# Patient Record
Sex: Female | Born: 1970
Health system: Southern US, Community
[De-identification: ages and names within clinical notes are randomized; demographics above are authoritative.]

## PROBLEM LIST (undated history)

## (undated) DIAGNOSIS — F419 Anxiety disorder, unspecified: Secondary | ICD-10-CM

## (undated) DIAGNOSIS — R519 Headache, unspecified: Secondary | ICD-10-CM

## (undated) DIAGNOSIS — I10 Essential (primary) hypertension: Secondary | ICD-10-CM

## (undated) DIAGNOSIS — R51 Headache: Secondary | ICD-10-CM

## (undated) DIAGNOSIS — Z972 Presence of dental prosthetic device (complete) (partial): Secondary | ICD-10-CM

## (undated) DIAGNOSIS — E785 Hyperlipidemia, unspecified: Secondary | ICD-10-CM

## (undated) DIAGNOSIS — F32A Depression, unspecified: Secondary | ICD-10-CM

## (undated) HISTORY — DX: Hyperlipidemia, unspecified: E78.5

## (undated) HISTORY — PX: ABDOMINAL HYSTERECTOMY: SHX81

## (undated) HISTORY — DX: Headache, unspecified: R51.9

## (undated) HISTORY — PX: MIDDLE EAR SURGERY: SHX713

## (undated) HISTORY — DX: Headache: R51

## (undated) HISTORY — DX: Anxiety disorder, unspecified: F41.9

## (undated) HISTORY — DX: Depression, unspecified: F32.A

---

## 2003-11-01 ENCOUNTER — Ambulatory Visit: Payer: Self-pay | Admitting: Unknown Physician Specialty

## 2005-11-25 ENCOUNTER — Emergency Department: Payer: Self-pay | Admitting: Emergency Medicine

## 2005-11-30 ENCOUNTER — Ambulatory Visit: Payer: Self-pay | Admitting: Surgery

## 2008-01-09 HISTORY — PX: CHOLECYSTECTOMY: SHX55

## 2008-09-23 ENCOUNTER — Ambulatory Visit: Payer: Self-pay | Admitting: Obstetrics and Gynecology

## 2008-09-29 ENCOUNTER — Ambulatory Visit: Payer: Self-pay | Admitting: Obstetrics and Gynecology

## 2009-01-07 ENCOUNTER — Observation Stay: Payer: Self-pay | Admitting: Obstetrics and Gynecology

## 2009-02-17 ENCOUNTER — Ambulatory Visit: Payer: Self-pay | Admitting: Obstetrics and Gynecology

## 2009-02-25 ENCOUNTER — Ambulatory Visit: Payer: Self-pay | Admitting: Obstetrics and Gynecology

## 2011-03-22 ENCOUNTER — Ambulatory Visit: Payer: Self-pay | Admitting: Obstetrics and Gynecology

## 2011-03-22 LAB — URINALYSIS, COMPLETE
Bilirubin,UR: NEGATIVE
Glucose,UR: NEGATIVE mg/dL (ref 0–75)
Ketone: NEGATIVE
Nitrite: NEGATIVE
Ph: 7 (ref 4.5–8.0)
Protein: NEGATIVE
RBC,UR: 1 /HPF (ref 0–5)
Specific Gravity: 1.006 (ref 1.003–1.030)
Squamous Epithelial: 14
WBC UR: 5 /HPF (ref 0–5)

## 2011-03-22 LAB — HEMOGLOBIN: HGB: 12.6 g/dL (ref 12.0–16.0)

## 2011-03-26 ENCOUNTER — Ambulatory Visit: Payer: Self-pay | Admitting: Obstetrics and Gynecology

## 2011-03-26 LAB — HEMOGLOBIN: HGB: 8.3 g/dL — ABNORMAL LOW (ref 12.0–16.0)

## 2011-03-26 LAB — PREGNANCY, URINE: Pregnancy Test, Urine: NEGATIVE m[IU]/mL

## 2011-03-26 LAB — HEMATOCRIT: HCT: 25.4 % — ABNORMAL LOW (ref 35.0–47.0)

## 2011-03-27 LAB — PATHOLOGY REPORT

## 2011-03-27 LAB — HEMATOCRIT: HCT: 31.5 % — ABNORMAL LOW (ref 35.0–47.0)

## 2011-08-06 ENCOUNTER — Ambulatory Visit: Payer: Self-pay | Admitting: Obstetrics and Gynecology

## 2012-08-29 ENCOUNTER — Ambulatory Visit: Payer: Self-pay | Admitting: Obstetrics and Gynecology

## 2014-05-02 NOTE — Op Note (Signed)
PATIENT NAME:  Alexis Berger, Alexis Berger MR#:  [id number] DATE OF BIRTH:  12/03/1970  DATE OF PROCEDURE:  03/26/2011  PREOPERATIVE DIAGNOSES:  1. Dysmenorrhea.  2. Evidence of hematometra on ultrasound.   POSTOPERATIVE DIAGNOSES:  1. Dysmenorrhea.  2. Evidence of hematometra on ultrasound.  3. Bilateral hydrosalpinx. 4. Severe adhesive disease.   PROCEDURES:  1. Dilation and curettage.  2. Hysteroscopy.  3. Laparoscopy. 4. Extensive adhesiolysis. 5. Bilateral salpingectomy. 6. Laparoscopic supracervical hysterectomy.   SURGEON: Ricky L. Logan Bores, MD  ASSISTANT: Suzy Bouchard, M.D.   ANESTHESIA: General orotracheal converted to general endotracheal by Dr. Pernell Dupre.   ESTIMATED BLOOD LOSS: 450 mL.   COMPLICATIONS: Suspected uterine perforation during dilation and curettage which led to laparoscopy with findings as noted above.   SPECIMENS: Uterus, bilateral tubes.   DRAINS: Foley placed at the end of case with good spillage of clear urine.   ANTIBIOTICS: 2 grams Ancef preoperatively and 2 grams Mefoxin postoperative.   PROCEDURE IN DETAIL: The patient is two years status post thermal ablation. She began having menstrual-type pains getting steadily worse over the past few months. Ultrasound showed what appeared to be trapped fluid at the upper fundus, unable to access intraoperatively and elected to proceed with dilation and curettage, hysteroscopy. Consent signed. Taken to the Operating Room, placed in supine position where anesthesia was initiated then placed in candy cane stirrups. Perineum and vagina prepped and draped in usual sterile fashion. Cervix was visualized, grasped with a single-tooth tenaculum. Dilated up to permit hysteroscope. Hysteroscopic entry showed what appeared to be a fairly thick fibrous wall at approximately 6 cm. Uterine sound was used to develop a tract which was dilated up to admit approximately 14 [place]. Attempted to place the camera and appeared to be a window  through which peritoneum could be visualized and therefore diagnosed with likely uterine perforation. Did not appear that we traveled along the obstructed canal.  At this time I elected to proceed with laparoscopy to evaluate possible perforation.    She was taken out of candy cane stirrups. Abdomen was prepped again. Foley catheter used to drain 100cc clear urine. Patient was placed in bumblebee stirrups and laparoscope was inserted infraumbilically and noted markedly dilated, hardened fallopian tubes bilaterally with the right fallopian to be involved with the ascending colon and adhered to the abdominal wall anteriorly. Left fallopian tube adherent more distally to small bowel. Second port was placed in left lower quadrant and was able to bluntly dissect free the right fallopian tube which had a clubbed appearance. At this point I called for Dr. Feliberto Gottron to come help and made the decision to proceed with bilateral salpingectomy and probable hysterectomy.   At this time we paused, asked circulator to bring the husband to the phone and discussed situation with him and stated they would prefer hysterectomy anyway and told me it was okay to proceed.    Performed bilateral salpingectomy using Harmonic scalpel; because of suspected infection, these were moved out of the operative field to be removed separately   With countertraction using the Harmonic scalpel to free the left ovary from the uterus and developed the broad ligament on the left. Bladder flap was developed cauterizing the right uterine artery with Kleppingers.  Proceeded in similar fashion on the right.  Uterus was separated rom the cervical stump using harmonic scalpel. Hulka tenaculum was removed. With sponge stick in the vagina (unable to reach with finger), persistent oozing from the right uterine artery was controlled with Kleppinger taking  care to elevate away from the ureter.   At this point the left lower quadrant port site was  replaced with a morcellator and uterus was removed in morcellator fashion.   We then used EndoCatch to remove the fallopian tubes separately with each tube containing a marked amount of chocolate colored fluid consistent with hematosalpinx. Pneumoperitoneum was allowed to return and hemosatsis was seen to be excellent. We were unable to identify the perforation prior to the hysterectomy. Ports removed. Pneumoperitoneum was allowed to resolve. Incisions closed with deep of 0, sub-Q 3-0 Vicryl, and steri strips. Foley catheter was inserted at the end of the case. Patient tolerated procedure well.   Procedure was extended by approximately two hours due to the extensive adhesiolysis required to free up the tubes from the abdominal wall and the bowel. I anticipate a routine postoperative course.   ____________________________ Reatha Harpsicky L. Logan BoresEvans, MD rle:cms D: 03/26/2011 12:00:48 ET T: 03/27/2011 08:33:55 ET JOB#: [id number]  cc: Clide Clifficky L. Logan BoresEvans, MD, <Dictator> Augustina MoodICK L Lateef Juncaj MD ELECTRONICALLY SIGNED 03/27/2011 16:22

## 2014-05-10 ENCOUNTER — Other Ambulatory Visit: Payer: Self-pay | Admitting: Unknown Physician Specialty

## 2014-05-10 DIAGNOSIS — H6120 Impacted cerumen, unspecified ear: Secondary | ICD-10-CM

## 2014-05-10 DIAGNOSIS — H7201 Central perforation of tympanic membrane, right ear: Secondary | ICD-10-CM

## 2014-05-10 DIAGNOSIS — H9211 Otorrhea, right ear: Secondary | ICD-10-CM

## 2014-05-19 ENCOUNTER — Ambulatory Visit: Payer: Self-pay

## 2014-05-19 ENCOUNTER — Ambulatory Visit
Admission: RE | Admit: 2014-05-19 | Discharge: 2014-05-19 | Disposition: A | Discharge status: Home / Self Care | Payer: 59 | Source: Ambulatory Visit | Attending: Unknown Physician Specialty | Admitting: Unknown Physician Specialty

## 2014-05-19 DIAGNOSIS — H6120 Impacted cerumen, unspecified ear: Secondary | ICD-10-CM

## 2014-05-19 DIAGNOSIS — H9211 Otorrhea, right ear: Secondary | ICD-10-CM

## 2014-05-19 DIAGNOSIS — H7011 Chronic mastoiditis, right ear: Secondary | ICD-10-CM | POA: Insufficient documentation

## 2014-05-19 DIAGNOSIS — H7201 Central perforation of tympanic membrane, right ear: Secondary | ICD-10-CM

## 2014-05-19 DIAGNOSIS — H7291 Unspecified perforation of tympanic membrane, right ear: Secondary | ICD-10-CM | POA: Diagnosis not present

## 2014-07-01 ENCOUNTER — Ambulatory Visit (INDEPENDENT_AMBULATORY_CARE_PROVIDER_SITE_OTHER): Payer: 59 | Admitting: Internal Medicine

## 2014-07-01 ENCOUNTER — Encounter (INDEPENDENT_AMBULATORY_CARE_PROVIDER_SITE_OTHER): Payer: Self-pay

## 2014-07-01 ENCOUNTER — Encounter: Payer: Self-pay | Admitting: Internal Medicine

## 2014-07-01 VITALS — BP 132/84 | HR 83 | Temp 98.1°F | Ht 64.0 in | Wt 195.0 lb

## 2014-07-01 DIAGNOSIS — G44209 Tension-type headache, unspecified, not intractable: Secondary | ICD-10-CM | POA: Diagnosis not present

## 2014-07-01 DIAGNOSIS — F329 Major depressive disorder, single episode, unspecified: Secondary | ICD-10-CM | POA: Insufficient documentation

## 2014-07-01 DIAGNOSIS — F411 Generalized anxiety disorder: Secondary | ICD-10-CM

## 2014-07-01 DIAGNOSIS — F419 Anxiety disorder, unspecified: Secondary | ICD-10-CM

## 2014-07-01 DIAGNOSIS — F32A Depression, unspecified: Secondary | ICD-10-CM | POA: Insufficient documentation

## 2014-07-01 DIAGNOSIS — I10 Essential (primary) hypertension: Secondary | ICD-10-CM | POA: Insufficient documentation

## 2014-07-01 DIAGNOSIS — R030 Elevated blood-pressure reading, without diagnosis of hypertension: Secondary | ICD-10-CM

## 2014-07-01 MED ORDER — BUPROPION HCL ER (XL) 150 MG PO TB24: 150.0000 mg | ORAL_TABLET | Freq: Every day | ORAL | Status: DC

## 2014-07-01 NOTE — Assessment & Plan Note (Signed)
Support offered today Will start Wellbutrin 150 mg daily She will let me know how she is doing in 4 weeks Unable to start SSRI as she is taking Phentermine and risk for Seratonin Syndrome

## 2014-07-01 NOTE — Progress Notes (Signed)
Pre visit review using our clinic review tool, if applicable. No additional management support is needed unless otherwise documented below in the visit note. 

## 2014-07-01 NOTE — Patient Instructions (Signed)
Stress and Stress Management Stress is a normal reaction to life events. It is what you feel when life demands more than you are used to or more than you can handle. Some stress can be useful. For example, the stress reaction can help you catch the last bus of the day, study for a test, or meet a deadline at work. But stress that occurs too often or for too long can cause problems. It can affect your emotional health and interfere with relationships and normal daily activities. Too much stress can weaken your immune system and increase your risk for physical illness. If you already have a medical problem, stress can make it worse. CAUSES  All sorts of life events may cause stress. An event that causes stress for one person may not be stressful for another person. Major life events commonly cause stress. These may be positive or negative. Examples include losing your job, moving into a new home, getting married, having a baby, or losing a loved one. Less obvious life events may also cause stress, especially if they occur day after day or in combination. Examples include working long hours, driving in traffic, caring for children, being in debt, or being in a difficult relationship. SIGNS AND SYMPTOMS Stress may cause emotional symptoms including, the following:  Anxiety. This is feeling worried, afraid, on edge, overwhelmed, or out of control.  Anger. This is feeling irritated or impatient.  Depression. This is feeling sad, down, helpless, or guilty.  Difficulty focusing, remembering, or making decisions. Stress may cause physical symptoms, including the following:   Aches and pains. These may affect your head, neck, back, stomach, or other areas of your body.  Tight muscles or clenched jaw.  Low energy or trouble sleeping. Stress may cause unhealthy behaviors, including the following:   Eating to feel better (overeating) or skipping meals.  Sleeping too little, too much, or both.  Working  too much or putting off tasks (procrastination).  Smoking, drinking alcohol, or using drugs to feel better. DIAGNOSIS  Stress is diagnosed through an assessment by your health care provider. Your health care provider will ask questions about your symptoms and any stressful life events.Your health care provider will also ask about your medical history and may order blood tests or other tests. Certain medical conditions and medicine can cause physical symptoms similar to stress. Mental illness can cause emotional symptoms and unhealthy behaviors similar to stress. Your health care provider may refer you to a mental health professional for further evaluation.  TREATMENT  Stress management is the recommended treatment for stress.The goals of stress management are reducing stressful life events and coping with stress in healthy ways.  Techniques for reducing stressful life events include the following:  Stress identification. Self-monitor for stress and identify what causes stress for you. These skills may help you to avoid some stressful events.  Time management. Set your priorities, keep a calendar of events, and learn to say "no." These tools can help you avoid making too many commitments. Techniques for coping with stress include the following:  Rethinking the problem. Try to think realistically about stressful events rather than ignoring them or overreacting. Try to find the positives in a stressful situation rather than focusing on the negatives.  Exercise. Physical exercise can release both physical and emotional tension. The key is to find a form of exercise you enjoy and do it regularly.  Relaxation techniques. These relax the body and mind. Examples include yoga, meditation, tai chi, biofeedback, deep  breathing, progressive muscle relaxation, listening to music, being out in nature, journaling, and other hobbies. Again, the key is to find one or more that you enjoy and can do  regularly.  Healthy lifestyle. Eat a balanced diet, get plenty of sleep, and do not smoke. Avoid using alcohol or drugs to relax.  Strong support network. Spend time with family, friends, or other people you enjoy being around.Express your feelings and talk things over with someone you trust. Counseling or talktherapy with a mental health professional may be helpful if you are having difficulty managing stress on your own. Medicine is typically not recommended for the treatment of stress.Talk to your health care provider if you think you need medicine for symptoms of stress. HOME CARE INSTRUCTIONS  Keep all follow-up visits as directed by your health care provider.  Take all medicines as directed by your health care provider. SEEK MEDICAL CARE IF:  Your symptoms get worse or you start having new symptoms.  You feel overwhelmed by your problems and can no longer manage them on your own. SEEK IMMEDIATE MEDICAL CARE IF:  You feel like hurting yourself or someone else. Document Released: 06/20/2000 Document Revised: 05/11/2013 Document Reviewed: 08/19/2012 ExitCare Patient Information 2015 [employer]. This information is not intended to replace advice given to you by your health care provider. Make sure you discuss any questions you have with your health care provider.  

## 2014-07-01 NOTE — Progress Notes (Signed)
HPI  Pt presents to the clinic today to establish care and for management of the conditions listed below. She did not have a PCP but followed yearly with her GYN, Dr. Logan Bores at [place].  Flu: 2014 Tetanus: > 10 years ago LMP: Hysterectomy Pap Smear: 2015- normal Mammogram: 2014 at [facility] Screening: as needed Dentist: bianually  Frequent Headaches: She reports it starts in the back of her head radiates down her neck and to the top of her head. She reports it feels like a pounding sensation. She denies blurred vision, dizziness, sensitivity to light or sound, nausea or vomiting. She has a headache almost every day. She is under a lot of stress at work at not sure if it is contributing. She takes Tylenol without much relief.  She is also concerned about elevated blood pressure. This has been going on for the last month. She reports it has been as high as 167/107. She is taking it with an automatic cuff. She denies blurred vision, dizziness, chest pain or shortness of breath. BP today is 132/84. She reports she is going to the weight loss clinic, and is prescribed Phentermine. She reports she has lost 42 pounds over the last year.  Anxiety: This started about 7 months ago. It is triggered by her daughters recent engagement. She reports her and her daughter have a very strong bond. She feels like she is losing her daughter. She cries all the time and anytime she has to do anything with the wedding planning, she gets very anxious and upset. She does not have any issues with her daughters fiancee. He is very nice. She reports she has never been treated for anxiety or depression in the past.  Past Medical History  Diagnosis Date  . Frequent headaches     Current Outpatient Prescriptions  Medication Sig Dispense Refill  . ibuprofen (ADVIL,MOTRIN) 200 MG tablet Take 200 mg by mouth as needed.    . phentermine 37.5 MG capsule Take 37.5 mg by mouth every morning.    . Probiotic Product  (PROBIOTIC DAILY) CAPS Take 1 capsule by mouth daily.    Bernadette Hoit Sodium (SENNA PLUS PO) Take 2 tablets by mouth 2 (two) times daily.     No current facility-administered medications for this visit.    No Known Allergies  Family History  Problem Relation Age of Onset  . Hypertension Mother     History   Social History  . Marital Status: Married    Spouse Name: N/A  . Number of Children: N/A  . Years of Education: N/A   Occupational History  . Not on file.   Social History Main Topics  . Smoking status: Never Smoker   . Smokeless tobacco: Never Used  . Alcohol Use: No  . Drug Use: Not on file  . Sexual Activity: Not on file   Other Topics Concern  . Not on file   Social History Narrative  . No narrative on file    ROS:  Constitutional: Pt reports headache. Denies fever, malaise, fatigue, or abrupt weight changes.  HEENT: Denies eye pain, eye redness, ear pain, ringing in the ears, wax buildup, runny nose, nasal congestion, bloody nose, or sore throat. Respiratory: Denies difficulty breathing, shortness of breath, cough or sputum production.   Cardiovascular: Denies chest pain, chest tightness, palpitations or swelling in the hands or feet.  Gastrointestinal: Denies abdominal pain, bloating, constipation, diarrhea or blood in the stool.  GU: Denies frequency, urgency, pain with urination, blood in  urine, odor or discharge. Musculoskeletal: Denies decrease in range of motion, difficulty with gait, muscle pain or joint pain and swelling.  Skin: Denies redness, rashes, lesions or ulcercations.  Neurological: Denies dizziness, difficulty with memory, difficulty with speech or problems with balance and coordination.  Psych: Pt reports anxiety, depression. Denies SI/HI.  No other specific complaints in a complete review of systems (except as listed in HPI above).  PE:  Ht  (1.626 m)  Wt 195 lb (88.451 kg)  BMI 33.46 kg/m2 Wt Readings from Last 3  Encounters:  07/01/14 195 lb (88.451 kg)    General: Appears her stated age, obese in NAD. Skin: Warm, dry and intact. HEENT: Head: normal shape and size; Eyes: sclera white, no icterus, conjunctiva pink, PERRLA and EOMs intact; Ears: Tm's gray and intact, normal light reflex;  Cardiovascular: Normal rate and rhythm. S1,S2 noted.  No murmur, rubs or gallops noted.  Pulmonary/Chest: Normal effort and positive vesicular breath sounds. No respiratory distress. No wheezes, rales or ronchi noted.  Musculoskeletal: Normal flexion, extension and rotation of the cervical spine. Para cervical muscles very tense.  Neurological: Alert and oriented.  Coordination normal.  Psychiatric: She is anxious and tearful today. Judgement and thought content seem normal.    Assessment and Plan:  Make an appt for your annual exam

## 2014-07-01 NOTE — Assessment & Plan Note (Signed)
Stress/anxiety related May improve with treatment of the stress and anxiety Ibuprofen 600 mg prn  Advised her to try getting a massage once monthly until her daughters wedding

## 2014-07-01 NOTE — Assessment & Plan Note (Signed)
Likely r/t Phentermine but not high enough for concern Will continue to monitor at this time

## 2014-07-23 ENCOUNTER — Other Ambulatory Visit: Payer: Self-pay | Admitting: Internal Medicine

## 2014-07-23 DIAGNOSIS — Z0000 Encounter for general adult medical examination without abnormal findings: Secondary | ICD-10-CM

## 2014-08-02 ENCOUNTER — Other Ambulatory Visit (INDEPENDENT_AMBULATORY_CARE_PROVIDER_SITE_OTHER): Payer: 59

## 2014-08-02 DIAGNOSIS — Z0000 Encounter for general adult medical examination without abnormal findings: Secondary | ICD-10-CM | POA: Diagnosis not present

## 2014-08-02 LAB — COMPREHENSIVE METABOLIC PANEL
ALT: 21 U/L (ref 0–35)
AST: 18 U/L (ref 0–37)
Albumin: 4.1 g/dL (ref 3.5–5.2)
Alkaline Phosphatase: 96 U/L (ref 39–117)
BUN: 9 mg/dL (ref 6–23)
CO2: 29 mEq/L (ref 19–32)
Calcium: 9.1 mg/dL (ref 8.4–10.5)
Chloride: 103 mEq/L (ref 96–112)
Creatinine, Ser: 0.84 mg/dL (ref 0.40–1.20)
GFR: 77.49 mL/min (ref >60.00)
Glucose, Bld: 115 mg/dL — ABNORMAL HIGH (ref 70–99)
Potassium: 3.7 mEq/L (ref 3.5–5.1)
Sodium: 140 mEq/L (ref 135–145)
Total Bilirubin: 0.4 mg/dL (ref 0.2–1.2)
Total Protein: 6.9 g/dL (ref 6.0–8.3)

## 2014-08-02 LAB — LIPID PANEL
Cholesterol: 174 mg/dL (ref 0–200)
HDL: 47.7 mg/dL (ref >39.00)
LDL Cholesterol: 91 mg/dL (ref 0–99)
NonHDL: 126.3
Total CHOL/HDL Ratio: 4
Triglycerides: 179 mg/dL — ABNORMAL HIGH (ref 0.0–149.0)
VLDL: 35.8 mg/dL (ref 0.0–40.0)

## 2014-08-02 LAB — CBC
HCT: 43.6 % (ref 36.0–46.0)
Hemoglobin: 14.5 g/dL (ref 12.0–15.0)
MCHC: 33.2 g/dL (ref 30.0–36.0)
MCV: 88.2 fl (ref 78.0–100.0)
Platelets: 247 10*3/uL (ref 150.0–400.0)
RBC: 4.94 Mil/uL (ref 3.87–5.11)
RDW: 13 % (ref 11.5–15.5)
WBC: 6.4 10*3/uL (ref 4.0–10.5)

## 2014-08-02 LAB — TSH: TSH: 3.6 u[IU]/mL (ref 0.35–4.50)

## 2014-08-06 ENCOUNTER — Ambulatory Visit (INDEPENDENT_AMBULATORY_CARE_PROVIDER_SITE_OTHER): Payer: 59 | Admitting: Internal Medicine

## 2014-08-06 ENCOUNTER — Encounter: Payer: Self-pay | Admitting: Internal Medicine

## 2014-08-06 VITALS — BP 128/88 | HR 77 | Temp 98.3°F | Ht 64.0 in | Wt 197.8 lb

## 2014-08-06 DIAGNOSIS — Z0000 Encounter for general adult medical examination without abnormal findings: Secondary | ICD-10-CM

## 2014-08-06 DIAGNOSIS — Z1239 Encounter for other screening for malignant neoplasm of breast: Secondary | ICD-10-CM

## 2014-08-06 NOTE — Progress Notes (Signed)
Pre visit review using our clinic review tool, if applicable. No additional management support is needed unless otherwise documented below in the visit note. 

## 2014-08-06 NOTE — Patient Instructions (Signed)

## 2014-08-06 NOTE — Progress Notes (Signed)
Subjective:    Patient ID: Alexis Berger, female    DOB: 1970-12-06, 44 y.o.   MRN: [id number]  HPI  Pt presents to the clinic today for her annual exam.  Flu: 2014 Tetanus: > 10 years ago LMP: Hysterectomy Pap Smear: 2015- normal Mammogram: 2014 at [facility] Vision Screening: as needed Dentist: bianually   Diet: She eats whatever she wants. Fried foods once per week. Fruits and veggies twice per week. She drinks mostly sweet tea, 2 cups of water per day. Exercise: She walks 4-5 times per week, for about 30 minutes. She can walk 2 miles during that time frame.  Review of Systems      Past Medical History  Diagnosis Date  . Frequent headaches     Current Outpatient Prescriptions  Medication Sig Dispense Refill  . buPROPion (WELLBUTRIN XL) 150 MG 24 hr tablet Take 1 tablet (150 mg total) by mouth daily. 30 tablet 2  . ibuprofen (ADVIL,MOTRIN) 200 MG tablet Take 200 mg by mouth as needed.    . phentermine 37.5 MG capsule Take 37.5 mg by mouth every morning.    . Probiotic Product (PROBIOTIC DAILY) CAPS Take 1 capsule by mouth daily.    Bernadette Hoit Sodium (SENNA PLUS PO) Take 2 tablets by mouth 2 (two) times daily.     No current facility-administered medications for this visit.    No Known Allergies  Family History  Problem Relation Age of Onset  . Hypertension Mother     History   Social History  . Marital Status: Married    Spouse Name: N/A  . Number of Children: N/A  . Years of Education: N/A   Occupational History  . Not on file.   Social History Main Topics  . Smoking status: Never Smoker   . Smokeless tobacco: Never Used  . Alcohol Use: No  . Drug Use: No  . Sexual Activity: Yes   Other Topics Concern  . Not on file   Social History Narrative     Constitutional: Denies fever, malaise, fatigue, headache or abrupt weight changes.  HEENT: Denies eye pain, eye redness, ear pain, ringing in the ears, wax buildup, runny nose, nasal  congestion, bloody nose, or sore throat. Respiratory: Denies difficulty breathing, shortness of breath, cough or sputum production.   Cardiovascular: Pt reports mild cough. Denies chest pain, chest tightness, palpitations or swelling in the hands or feet.  Gastrointestinal: Pt reports constipation. Denies abdominal pain, bloating, constipation, diarrhea or blood in the stool.  GU: Denies urgency, frequency, pain with urination, burning sensation, blood in urine, odor or discharge. Musculoskeletal: Denies decrease in range of motion, difficulty with gait, muscle pain or joint pain and swelling.  Skin: Denies redness, rashes, lesions or ulcercations.  Neurological: Denies dizziness, difficulty with memory, difficulty with speech or problems with balance and coordination.  Psych: Pt reports anxiety. Denies depression, SI/HI.  No other specific complaints in a complete review of systems (except as listed in HPI above).  Objective:   Physical Exam    BP 128/88 mmHg  Pulse 77  Temp(Src) 98.3 F (36.8 C) (Oral)  Ht  (1.626 m)  Wt 197 lb 12 oz (89.699 kg)  BMI 33.93 kg/m2  SpO2 97% Wt Readings from Last 3 Encounters:  08/06/14 197 lb 12 oz (89.699 kg)  07/01/14 195 lb (88.451 kg)    General: Appears her stated age, well developed, well nourished in NAD. Skin: Warm, dry and intact. No rashes, lesions or ulcerations noted.  HEENT: Head: normal shape and size; Eyes: sclera white, no icterus, conjunctiva pink, PERRLA and EOMs intact; Left Ear: cerumen impaction, Right Ear: Large hole in the TM; Throat/Mouth: Teeth present, mucosa pink and moist, no exudate, lesions or ulcerations noted.  Neck:  Neck supple, trachea midline. No masses, lumps or thyromegaly present.  Cardiovascular: Normal rate and rhythm. S1,S2 noted.  No murmur, rubs or gallops noted. No JVD or BLE edema. No carotid bruits noted. Pulmonary/Chest: Normal effort and positive vesicular breath sounds. No respiratory distress.  No wheezes, rales or ronchi noted.  Abdomen: Soft and nontender. Normal bowel sounds, no bruits noted. No distention or masses noted. Liver, spleen and kidneys non palpable. Musculoskeletal: Normal range of motion. Strength 5/5 BUE/BLE. No difficulty with gait.  Neurological: Alert and oriented. Cranial nerves II-XII grossly intact. Coordination normal.  Psychiatric: Mood and affect normal. She does feel like the Wellbutrin is taking the edge off.     BMET    Component Value Date/Time   NA 140 08/02/2014 0812   K 3.7 08/02/2014 0812   CL 103 08/02/2014 0812   CO2 29 08/02/2014 0812   GLUCOSE 115* 08/02/2014 0812   BUN 9 08/02/2014 0812   CREATININE 0.84 08/02/2014 0812   CALCIUM 9.1 08/02/2014 0812    Lipid Panel     Component Value Date/Time   CHOL 174 08/02/2014 0812   TRIG 179.0* 08/02/2014 0812   HDL 47.70 08/02/2014 0812   CHOLHDL 4 08/02/2014 0812   VLDL 35.8 08/02/2014 0812   LDLCALC 91 08/02/2014 0812    CBC    Component Value Date/Time   WBC 6.4 08/02/2014 0812   RBC 4.94 08/02/2014 0812   HGB 14.5 08/02/2014 0812   HGB 8.3* 03/26/2011 1155   HCT 43.6 08/02/2014 0812   HCT 31.5* 03/27/2011 0559   PLT 247.0 08/02/2014 0812   MCV 88.2 08/02/2014 0812   MCHC 33.2 08/02/2014 0812   RDW 13.0 08/02/2014 0812    Hgb A1C No results found for: HGBA1C     Assessment & Plan:   Preventative Health Maintenance:  Advised her to get her flu shot in the fall She declines Tdap today Pelvic exam due in 2020 Encouraged her to work on diet and exercise CBC, CMET, TSH and Lipid Profile 1 week ago reviewed, slightly elevated Triglycerides- Advised her to consume a low fat, low cholesterol diet. Fish oil once daily may be beneficial as well. Referral placed for Mammogram, she will call [facility] to schedule Continue to see an eye doctor and dentist on an annual basis  RTC in 1 year for an annual exam

## 2014-09-24 ENCOUNTER — Other Ambulatory Visit: Payer: Self-pay | Admitting: Internal Medicine

## 2015-07-01 ENCOUNTER — Encounter: Payer: 59 | Admitting: Internal Medicine

## 2015-08-25 ENCOUNTER — Encounter: Payer: Self-pay | Admitting: Internal Medicine

## 2015-08-25 ENCOUNTER — Other Ambulatory Visit: Payer: Self-pay | Admitting: Internal Medicine

## 2015-08-25 ENCOUNTER — Ambulatory Visit (INDEPENDENT_AMBULATORY_CARE_PROVIDER_SITE_OTHER): Payer: 59 | Admitting: Internal Medicine

## 2015-08-25 VITALS — BP 142/96 | HR 78 | Temp 98.8°F | Wt 211.0 lb

## 2015-08-25 DIAGNOSIS — F329 Major depressive disorder, single episode, unspecified: Secondary | ICD-10-CM

## 2015-08-25 DIAGNOSIS — F32A Depression, unspecified: Secondary | ICD-10-CM

## 2015-08-25 DIAGNOSIS — F418 Other specified anxiety disorders: Secondary | ICD-10-CM

## 2015-08-25 DIAGNOSIS — F419 Anxiety disorder, unspecified: Principal | ICD-10-CM

## 2015-08-25 LAB — LUTEINIZING HORMONE: LH: 1.24 m[IU]/mL

## 2015-08-25 LAB — TSH: TSH: 3.19 u[IU]/mL (ref 0.35–4.50)

## 2015-08-25 LAB — FOLLICLE STIMULATING HORMONE: FSH: 3.9 m[IU]/mL

## 2015-08-25 MED ORDER — SERTRALINE HCL 50 MG PO TABS: ORAL_TABLET | ORAL | 2 refills | Status: DC

## 2015-08-25 MED ORDER — ALPRAZOLAM 0.5 MG PO TABS: 0.5000 mg | ORAL_TABLET | Freq: Every day | ORAL | 0 refills | Status: DC | PRN

## 2015-08-25 NOTE — Progress Notes (Signed)
Subjective:    Patient ID: Alexis Berger, female    DOB: 07-31-1970, 45 y.o.   MRN: [id number]  HPI  Pt presents to the clinic today to follow up stress and anxiety. She reports she has a lot of stress as work. Her father was also recently diagnosed with Alzheimers and this is weighing heavily on her. Her husband recently had back surgery and she is having to help take care of him. She feels overwhelmed, is having mood swings and cries over little things. She is not sleeping well, having hot flashes at times. At times, she feels like her heart is racing and feelings like her heart is skipping a beat. She reports the Wellbutrin is not helping like it once was. She denies SI/HI. She would like to see if she is menopausal, she is unsure because she had a partial hysterectomy.  Review of Systems      Past Medical History:  Diagnosis Date  . Frequent headaches     Current Outpatient Prescriptions  Medication Sig Dispense Refill  . buPROPion (WELLBUTRIN XL) 150 MG 24 hr tablet TAKE ONE TABLET BY MOUTH EVERY DAY 30 tablet 5  . ibuprofen (ADVIL,MOTRIN) 200 MG tablet Take 200 mg by mouth as needed.    . Probiotic Product (PROBIOTIC DAILY) CAPS Take 1 capsule by mouth daily.    Bernadette Hoit. Sennosides-Docusate Sodium (SENNA PLUS PO) Take 2 tablets by mouth 2 (two) times daily.    . phentermine 37.5 MG capsule Take 37.5 mg by mouth every morning.     No current facility-administered medications for this visit.     No Known Allergies  Family History  Problem Relation Age of Onset  . Hypertension Mother     Social History   Social History  . Marital status: Married    Spouse name: N/A  . Number of children: N/A  . Years of education: N/A   Occupational History  . Not on file.   Social History Main Topics  . Smoking status: Never Smoker  . Smokeless tobacco: Never Used  . Alcohol use No  . Drug use: No  . Sexual activity: Yes   Other Topics Concern  . Not on file   Social History  Narrative  . No narrative on file     Constitutional: Denies fever, malaise, fatigue, headache or abrupt weight changes.  Respiratory: Denies difficulty breathing, shortness of breath, cough or sputum production.   Cardiovascular: Pt reports palpitation.s Denies chest pain, chest tightness, or swelling in the hands or feet.  Neurological: Denies dizziness, difficulty with memory, difficulty with speech or problems with balance and coordination.  Psych: Pt reports anxiety and depression. Denies SI/HI.  No other specific complaints in a complete review of systems (except as listed in HPI above).  Objective:   Physical Exam  BP (!) 142/96   Pulse 78   Temp 98.8 F (37.1 C) (Oral)   Wt 211 lb (95.7 kg)   SpO2 98%   BMI 36.22 kg/m  Wt Readings from Last 3 Encounters:  08/25/15 211 lb (95.7 kg)  08/06/14 197 lb 12 oz (89.7 kg)  07/01/14 195 lb (88.5 kg)    General: Appears her stated age, obese in NAD. Cardiovascular: Normal rate and rhythm. S1,S2 noted.  No murmur, rubs or gallops noted.  Pulmonary/Chest: Normal effort and positive vesicular breath sounds. No respiratory distress. No wheezes, rales or ronchi noted.  Neurological: Alert and oriented.  Psychiatric: She is tearful today throughout the interview.  BMET    Component Value Date/Time   NA 140 08/02/2014 0812   K 3.7 08/02/2014 0812   CL 103 08/02/2014 0812   CO2 29 08/02/2014 0812   GLUCOSE 115 (H) 08/02/2014 0812   BUN 9 08/02/2014 0812   CREATININE 0.84 08/02/2014 0812   CALCIUM 9.1 08/02/2014 0812    Lipid Panel     Component Value Date/Time   CHOL 174 08/02/2014 0812   TRIG 179.0 (H) 08/02/2014 0812   HDL 47.70 08/02/2014 0812   CHOLHDL 4 08/02/2014 0812   VLDL 35.8 08/02/2014 0812   LDLCALC 91 08/02/2014 0812    CBC    Component Value Date/Time   WBC 6.4 08/02/2014 0812   RBC 4.94 08/02/2014 0812   HGB 14.5 08/02/2014 0812   HGB 8.3 (L) 03/26/2011 1155   HCT 43.6 08/02/2014 0812   HCT 31.5  (L) 03/27/2011 0559   PLT 247.0 08/02/2014 0812   MCV 88.2 08/02/2014 0812   MCHC 33.2 08/02/2014 0812   RDW 13.0 08/02/2014 0812    Hgb A1C No results found for: HGBA1C          Assessment & Plan:   Anxiety and Depression:  Situational PHQ9 score of 6 GAD 7 score of 11 Will check TSH, FSH, LH today If labs normal, she is willing to stop Phentermine to start Zoloft RX for Xanax 0.5 mg daily prn given, advised her to only take this for times when she feels panicky Support offered today Offered referral to therapy but she declines at this time due to time constraints  Follow up in 4 weeks Hisham Provence, NP

## 2015-08-25 NOTE — Patient Instructions (Signed)
Generalized Anxiety Disorder Generalized anxiety disorder (GAD) is a mental disorder. It interferes with life functions, including relationships, work, and school. GAD is different from normal anxiety, which everyone experiences at some point in their lives in response to specific life events and activities. Normal anxiety actually helps us prepare for and get through these life events and activities. Normal anxiety goes away after the event or activity is over.  GAD causes anxiety that is not necessarily related to specific events or activities. It also causes excess anxiety in proportion to specific events or activities. The anxiety associated with GAD is also difficult to control. GAD can vary from mild to severe. People with severe GAD can have intense waves of anxiety with physical symptoms (panic attacks).  SYMPTOMS The anxiety and worry associated with GAD are difficult to control. This anxiety and worry are related to many life events and activities and also occur more days than not for 6 months or longer. People with GAD also have three or more of the following symptoms (one or more in children):  Restlessness.   Fatigue.  Difficulty concentrating.   Irritability.  Muscle tension.  Difficulty sleeping or unsatisfying sleep. DIAGNOSIS GAD is diagnosed through an assessment by your health care provider. Your health care provider will ask you questions aboutyour mood,physical symptoms, and events in your life. Your health care provider may ask you about your medical history and use of alcohol or drugs, including prescription medicines. Your health care provider may also do a physical exam and blood tests. Certain medical conditions and the use of certain substances can cause symptoms similar to those associated with GAD. Your health care provider may refer you to a mental health specialist for further evaluation. TREATMENT The following therapies are usually used to treat GAD:    Medication. Antidepressant medication usually is prescribed for long-term daily control. Antianxiety medicines may be added in severe cases, especially when panic attacks occur.   Talk therapy (psychotherapy). Certain types of talk therapy can be helpful in treating GAD by providing support, education, and guidance. A form of talk therapy called cognitive behavioral therapy can teach you healthy ways to think about and react to daily life events and activities.  Stress managementtechniques. These include yoga, meditation, and exercise and can be very helpful when they are practiced regularly. A mental health specialist can help determine which treatment is best for you. Some people see improvement with one therapy. However, other people require a combination of therapies.   This information is not intended to replace advice given to you by your health care provider. Make sure you discuss any questions you have with your health care provider.   Document Released: 04/21/2012 Document Revised: 01/15/2014 Document Reviewed: 04/21/2012 Elsevier Interactive Patient Education 2016 [employer]  

## 2015-08-30 ENCOUNTER — Telehealth: Payer: Self-pay | Admitting: Internal Medicine

## 2015-08-30 NOTE — Telephone Encounter (Signed)
Pt called checking on labs and medication  [facility] number [phone]

## 2015-08-31 NOTE — Telephone Encounter (Signed)
Please refer to result note.

## 2015-10-21 ENCOUNTER — Other Ambulatory Visit: Payer: Self-pay

## 2015-10-21 NOTE — Telephone Encounter (Signed)
Last filled 08/30/15 for PRN at [facility]--please advise if okay to refill

## 2015-10-24 NOTE — Telephone Encounter (Signed)
Ok to phone in Xanax  *please change class to "phone in" before sending RX request 

## 2015-10-25 MED ORDER — ALPRAZOLAM 0.5 MG PO TABS: 0.5000 mg | ORAL_TABLET | Freq: Every day | ORAL | 0 refills | Status: DC | PRN

## 2015-10-25 NOTE — Telephone Encounter (Signed)
Rx called in to pharmacy. 

## 2016-02-14 ENCOUNTER — Ambulatory Visit (INDEPENDENT_AMBULATORY_CARE_PROVIDER_SITE_OTHER): Payer: 59 | Admitting: Internal Medicine

## 2016-02-14 ENCOUNTER — Encounter: Payer: Self-pay | Admitting: Internal Medicine

## 2016-02-14 VITALS — BP 126/84 | HR 85 | Temp 98.1°F | Ht 64.0 in | Wt 231.0 lb

## 2016-02-14 DIAGNOSIS — Z1239 Encounter for other screening for malignant neoplasm of breast: Secondary | ICD-10-CM

## 2016-02-14 DIAGNOSIS — F329 Major depressive disorder, single episode, unspecified: Secondary | ICD-10-CM

## 2016-02-14 DIAGNOSIS — I10 Essential (primary) hypertension: Secondary | ICD-10-CM | POA: Diagnosis not present

## 2016-02-14 DIAGNOSIS — F418 Other specified anxiety disorders: Secondary | ICD-10-CM | POA: Diagnosis not present

## 2016-02-14 DIAGNOSIS — Z1231 Encounter for screening mammogram for malignant neoplasm of breast: Secondary | ICD-10-CM

## 2016-02-14 DIAGNOSIS — Z0001 Encounter for general adult medical examination with abnormal findings: Secondary | ICD-10-CM

## 2016-02-14 DIAGNOSIS — F419 Anxiety disorder, unspecified: Secondary | ICD-10-CM

## 2016-02-14 DIAGNOSIS — F32A Depression, unspecified: Secondary | ICD-10-CM

## 2016-02-14 LAB — COMPREHENSIVE METABOLIC PANEL
ALT: 24 U/L (ref 0–35)
AST: 16 U/L (ref 0–37)
Albumin: 4.1 g/dL (ref 3.5–5.2)
Alkaline Phosphatase: 85 U/L (ref 39–117)
BUN: 11 mg/dL (ref 6–23)
CO2: 31 mEq/L (ref 19–32)
Calcium: 9.4 mg/dL (ref 8.4–10.5)
Chloride: 104 mEq/L (ref 96–112)
Creatinine, Ser: 0.84 mg/dL (ref 0.40–1.20)
GFR: 76.98 mL/min (ref >60.00)
Glucose, Bld: 112 mg/dL — ABNORMAL HIGH (ref 70–99)
Potassium: 3.9 mEq/L (ref 3.5–5.1)
Sodium: 140 mEq/L (ref 135–145)
Total Bilirubin: 0.4 mg/dL (ref 0.2–1.2)
Total Protein: 6.9 g/dL (ref 6.0–8.3)

## 2016-02-14 LAB — LIPID PANEL
Cholesterol: 183 mg/dL (ref 0–200)
HDL: 52.1 mg/dL (ref >39.00)
LDL Cholesterol: 93 mg/dL (ref 0–99)
NonHDL: 131.05
Total CHOL/HDL Ratio: 4
Triglycerides: 190 mg/dL — ABNORMAL HIGH (ref 0.0–149.0)
VLDL: 38 mg/dL (ref 0.0–40.0)

## 2016-02-14 LAB — CBC
HCT: 42.2 % (ref 36.0–46.0)
Hemoglobin: 14.4 g/dL (ref 12.0–15.0)
MCHC: 34.2 g/dL (ref 30.0–36.0)
MCV: 86.1 fl (ref 78.0–100.0)
Platelets: 241 10*3/uL (ref 150.0–400.0)
RBC: 4.9 Mil/uL (ref 3.87–5.11)
RDW: 13.6 % (ref 11.5–15.5)
WBC: 6 10*3/uL (ref 4.0–10.5)

## 2016-02-14 LAB — VITAMIN D 25 HYDROXY (VIT D DEFICIENCY, FRACTURES): VITD: 32.28 ng/mL (ref 30.00–100.00)

## 2016-02-14 LAB — HEMOGLOBIN A1C: Hgb A1c MFr Bld: 5.6 % (ref 4.6–6.5)

## 2016-02-14 MED ORDER — SERTRALINE HCL 100 MG PO TABS: 100.0000 mg | ORAL_TABLET | Freq: Every day | ORAL | 2 refills | Status: DC

## 2016-02-14 NOTE — Progress Notes (Addendum)
Subjective:    Patient ID: Alexis Berger, female    DOB: [birth year], 46 y.o.   MRN: [id number]  HPI  Pt presents to the clinic today for her annual exam. She is also due to follow up chronic conditions.  Anxiety and Depression: Worse over the last year. Triggered by work stress, father with Alzheimers, etc. She has been on Wellbutrin but really felt like it was not effective as it had been in the past, so we stopped it. She was  started on Zoloft and given Xanax to take on a very rare basis, 08/2015. Since that time, she reports that she forgets to take the Zoloft on a daily basis because she did not feel like it is helping. She takes the Xanax about 1 x week.  Elevated Blood Pressure: Her BP at her last visit was 142/96. She had never had elevated blood pressures in the past. She was asymptomatic. We decided to monitor, and counseled on diet and exercise changes. Her BP today is 126/84.  Flu: 2014 Tetanus: > 10 years ago Pap Smear: 07/2012, partial hysterectomy Mammogram: 08/2012, [facility] Screening: annually Dentist: biannually  Diet: She does eat meat. She consumes fruits and veggies most days. She does eat some fried food. She drinks mostly sweet tea, some water. Exercise: None  Review of Systems      Past Medical History:  Diagnosis Date  . Frequent headaches     Current Outpatient Prescriptions  Medication Sig Dispense Refill  . ALPRAZolam (XANAX) 0.5 MG tablet Take 1 tablet (0.5 mg total) by mouth daily as needed for anxiety. 20 tablet 0  . buPROPion (WELLBUTRIN XL) 150 MG 24 hr tablet TAKE ONE TABLET BY MOUTH EVERY DAY 30 tablet 5  . ibuprofen (ADVIL,MOTRIN) 200 MG tablet Take 200 mg by mouth as needed.    . Probiotic Product (PROBIOTIC DAILY) CAPS Take 1 capsule by mouth daily.    Bernadette Hoit Sodium (SENNA PLUS PO) Take 2 tablets by mouth 2 (two) times daily.    . sertraline (ZOLOFT) 50 MG tablet Take 1/2 tablet daily x 1 week then increase to a whole  tab daily. 30 tablet 2   No current facility-administered medications for this visit.     No Known Allergies  Family History  Problem Relation Age of Onset  . Hypertension Mother     Social History   Social History  . Marital status: Married    Spouse name: N/A  . Number of children: N/A  . Years of education: N/A   Occupational History  . Not on file.   Social History Main Topics  . Smoking status: Never Smoker  . Smokeless tobacco: Never Used  . Alcohol use No  . Drug use: No  . Sexual activity: Yes   Other Topics Concern  . Not on file   Social History Narrative  . No narrative on file     Constitutional: Pt reports weight gain. Denies fever, malaise, fatigue, headache.  HEENT: Denies eye pain, eye redness, ear pain, ringing in the ears, wax buildup, runny nose, nasal congestion, bloody nose, or sore throat. Respiratory: Denies difficulty breathing, shortness of breath, cough or sputum production.   Cardiovascular: Pt reports chest tightness (anxiety related). Denies chest pain, chest tightness, palpitations or swelling in the hands or feet.  Gastrointestinal: Denies abdominal pain, bloating, constipation, diarrhea or blood in the stool.  GU: Denies urgency, frequency, pain with urination, burning sensation, blood in urine, odor or discharge. Musculoskeletal: Denies  decrease in range of motion, difficulty with gait, muscle pain or joint pain and swelling.  Skin: Denies redness, rashes, lesions or ulcercations.  Neurological: Denies dizziness, difficulty with memory, difficulty with speech or problems with balance and coordination.  Psych: Pt reports anxiety and depression. Denies SI/HI.  No other specific complaints in a complete review of systems (except as listed in HPI above).  Objective:   Physical Exam   BP 126/84   Pulse 85   Temp 98.1 F (36.7 C) (Oral)   Ht $RemoveBefor [id number]$5\' 4" 39.65 kg/m  Wt Readings from  Last 3 Encounters:  02/14/16 231 lb (104.8 kg)  08/25/15 211 lb (95.7 kg)  08/06/14 197 lb 12 oz (89.7 kg)    General: Appears her stated age, obese in NAD. Skin: Warm, dry and intact.  HEENT: Head: normal shape and size; Eyes: sclera white, no icterus, conjunctiva pink, PERRLA and EOMs intact; Right Ear: Tm's gray and intact, normal light reflex; Left Ear: cerumen impaction; Throat/Mouth: Teeth present, mucosa pink and moist, no exudate, lesions or ulcerations noted.  Neck:  Neck supple, trachea midline. No masses, lumps or thyromegaly present.  Cardiovascular: Normal rate and rhythm. S1,S2 noted.  No murmur, rubs or gallops noted. No JVD or BLE edema. No carotid bruits noted. Pulmonary/Chest: Normal effort and positive vesicular breath sounds. No respiratory distress. No wheezes, rales or ronchi noted.  Abdomen: Soft and nontender. Normal bowel sounds. No distention or masses noted. Liver, spleen and kidneys non palpable. Musculoskeletal: Strength 5/5 BUE/BLE. No difficulty with gait.  Neurological: Alert and oriented. Cranial nerves II-XII grossly intact. Coordination normal.  Psychiatric: Mood and affect normal. Behavior is normal. Judgment and thought content normal.     BMET    Component Value Date/Time   NA 140 08/02/2014 0812   K 3.7 08/02/2014 0812   CL 103 08/02/2014 0812   CO2 29 08/02/2014 0812   GLUCOSE 115 (H) 08/02/2014 0812   BUN 9 08/02/2014 0812   CREATININE 0.84 08/02/2014 0812   CALCIUM 9.1 08/02/2014 0812    Lipid Panel     Component Value Date/Time   CHOL 174 08/02/2014 0812   TRIG 179.0 (H) 08/02/2014 0812   HDL 47.70 08/02/2014 0812   CHOLHDL 4 08/02/2014 0812   VLDL 35.8 08/02/2014 0812   LDLCALC 91 08/02/2014 0812    CBC    Component Value Date/Time   WBC 6.4 08/02/2014 0812   RBC 4.94 08/02/2014 0812   HGB 14.5 08/02/2014 0812   HGB 8.3 (L) 03/26/2011 1155   HCT 43.6 08/02/2014 0812   HCT 31.5 (L) 03/27/2011 0559   PLT 247.0 08/02/2014  0812   MCV 88.2 08/02/2014 0812   MCHC 33.2 08/02/2014 0812   RDW 13.0 08/02/2014 0812    Hgb A1C No results found for: HGBA1C      Assessment & Plan:   Preventative Health Maintenance:  She declines flu or tetanus today Pelvic exam due 2019 Mammogram ordered- she will call [facility] to schedule- number provided Encouraged her to consume a balanced diet and exercise regimen Advised her to see an eye doctor and dentist annually Will check CBC, CMET, Lipid, A1C and Vit D today  RTC in 1 year, sooner if neeed Nicki ReaperBAITY, Talma Aguillard, NP

## 2016-02-14 NOTE — Patient Instructions (Signed)

## 2016-02-14 NOTE — Assessment & Plan Note (Signed)
She is refusing to start medication at this time Discussed the risk of long term uncontrolled HTN She reports that she wants to really try to exercise for weight loss, to improve her BP

## 2016-02-14 NOTE — Assessment & Plan Note (Signed)
No improvement Support offered today Discussed the importance of taking the Zoloft daily, or it will be ineffective Will will increase Zoloft to 100 mg daily Continue Xanax prn She will update me in 4 weeks via mychart to let me know how she is doing

## 2016-03-02 ENCOUNTER — Ambulatory Visit
Admission: RE | Admit: 2016-03-02 | Discharge: 2016-03-02 | Disposition: A | Discharge status: Home / Self Care | Payer: 59 | Source: Ambulatory Visit | Attending: Internal Medicine | Admitting: Internal Medicine

## 2016-03-02 DIAGNOSIS — Z1231 Encounter for screening mammogram for malignant neoplasm of breast: Secondary | ICD-10-CM | POA: Diagnosis not present

## 2016-03-02 DIAGNOSIS — Z1239 Encounter for other screening for malignant neoplasm of breast: Secondary | ICD-10-CM

## 2016-04-16 DIAGNOSIS — M79671 Pain in right foot: Secondary | ICD-10-CM | POA: Diagnosis not present

## 2016-04-16 DIAGNOSIS — M7661 Achilles tendinitis, right leg: Secondary | ICD-10-CM | POA: Diagnosis not present

## 2016-04-16 DIAGNOSIS — M7731 Calcaneal spur, right foot: Secondary | ICD-10-CM | POA: Diagnosis not present

## 2016-05-08 ENCOUNTER — Other Ambulatory Visit: Payer: Self-pay | Admitting: Internal Medicine

## 2016-05-08 MED ORDER — ALPRAZOLAM 0.5 MG PO TABS: 0.5000 mg | ORAL_TABLET | Freq: Every day | ORAL | 0 refills | Status: DC | PRN

## 2016-05-08 NOTE — Telephone Encounter (Signed)
Ok to phone in Xanax 

## 2016-05-08 NOTE — Telephone Encounter (Signed)
Rx called in to pharmacy. 

## 2016-05-08 NOTE — Telephone Encounter (Signed)
Last filled 10/2015--please advise 

## 2016-06-11 ENCOUNTER — Telehealth: Payer: Self-pay | Admitting: Internal Medicine

## 2016-06-11 NOTE — Telephone Encounter (Signed)
Patient Name: Alexis Berger  DOB: 1970-12-25    Initial Comment Caller says her BP has been higher than normal; it was 170/104. No cuff at home to measure. Having headaches.    Nurse Assessment  Nurse: Joylene DraftMills-Hernandez, RN, Harriett SineNancy Date/Time (Eastern Time): 06/11/2016 5:11:22 PM  Confirm and document reason for call. If symptomatic, describe symptoms. ---Caller says her BP has been higher than normal; it was 170/104. No cuff at home to measure. Having headaches.  Does the patient have any new or worsening symptoms? ---Yes  Will a triage be completed? ---Yes  Related visit to physician within the last 2 weeks? ---No  Does the PT have any chronic conditions? (i.e. diabetes, asthma, etc.) ---Yes  List chronic conditions. ---HTN,  Is the patient pregnant or possibly pregnant? (Ask all females between the ages of 4812-55) ---No  Is this a behavioral health or substance abuse call? ---No     Guidelines    Guideline Title Affirmed Question Affirmed Notes  High Blood Pressure [1] Systolic BP >= 160 OR Diastolic >= 100 AND [2] cardiac or neurologic symptoms (e.g., chest pain, difficulty breathing, unsteady gait, blurred vision)    Final Disposition User   Go to ED Now Mills-Hernandez, RN, Harriett SineNancy    Comments  Nurse tried to choose

## 2016-06-12 NOTE — Telephone Encounter (Signed)
I spoke with pt who was at work; pt does not have H/A today; no CP, SOB or dizziness. Pt does not have a way to ck BP today.Pt does not take BP med. Pt did not go to ED on 06/11/16; pt did not think necessary. [facility] No available appts today and [facility]. R Baity,NP who is primary provider is out of the office the remainder of this week. Pt needs afternoon appt. Pt scheduled 30' appt with Dr Patsy Lageropland on 06/13/16 at 2:45. If pt condition worsens prior to appt pt [facility]. FYI to Dr Patsy Lageropland,

## 2016-06-13 ENCOUNTER — Encounter: Payer: Self-pay | Admitting: Family Medicine

## 2016-06-13 ENCOUNTER — Ambulatory Visit (INDEPENDENT_AMBULATORY_CARE_PROVIDER_SITE_OTHER): Payer: 59 | Admitting: Family Medicine

## 2016-06-13 VITALS — BP 151/104 | HR 89 | Temp 98.5°F | Ht 64.0 in | Wt 233.2 lb

## 2016-06-13 DIAGNOSIS — I10 Essential (primary) hypertension: Secondary | ICD-10-CM

## 2016-06-13 MED ORDER — HYDROCHLOROTHIAZIDE 12.5 MG PO TABS: 12.5000 mg | ORAL_TABLET | Freq: Every day | ORAL | 3 refills | Status: DC

## 2016-06-13 NOTE — Progress Notes (Signed)
Dr. Karleen Hampshire T. Parminder Trapani, MD, CAQ Sports Medicine [facility] [address] [place], [address] Phone: [phone] Fax: [phone]  06/13/2016  Patient: Alexis Berger, MRN: [id number], DOB: 07-07-1970, 46 y.o.  Primary Physician:  Lorre Munroe, NP   Chief Complaint  Patient presents with  . Hypertension  . Headache   Subjective:   Alexis Berger is a 46 y.o. very pleasant female patient who presents with the following:  Has been under a lot of stress under the last few months.   BP Readings from Last 3 Encounters:  06/13/16 (!) 151/104  02/14/16 126/84  08/25/15 (!) 142/96    Father had a heart attack. A lot going on - short staffed at work.   Wt Readings from Last 3 Encounters:  06/13/16 233 lb 4 oz (105.8 kg)  02/14/16 231 lb (104.8 kg)  08/25/15 211 lb (95.7 kg)    Gained 22 pounds in 10 monts.  She had blood pressures were elevated to the point 1 seventies when she called her nurse line a couple of days ago. Today they're still elevated as above. There have been elevated on multiple occasions, defining hypertension.  Headache is somewhat better today, but still present.  Past Medical History, Surgical History, Social History, Family History, Problem List, Medications, and Allergies have been reviewed and updated if relevant.  Patient Active Problem List   Diagnosis Date Noted  . Anxiety and depression 07/01/2014  . HTN (hypertension) 07/01/2014    Past Medical History:  Diagnosis Date  . Frequent headaches     Past Surgical History:  Procedure Laterality Date  . ABDOMINAL HYSTERECTOMY     partial  . CESAREAN SECTION    . CHOLECYSTECTOMY  2010    Social History   Social History  . Marital status: Married    Spouse name: N/A  . Number of children: N/A  . Years of education: N/A   Occupational History  . Not on file.   Social History Main Topics  . Smoking status: Never Smoker  . Smokeless tobacco: Never Used  .  Alcohol use No  . Drug use: No  . Sexual activity: Yes   Other Topics Concern  . Not on file   Social History Narrative  . No narrative on file    Family History  Problem Relation Age of Onset  . Hypertension Mother     No Known Allergies  Medication list reviewed and updated in full in [place] [facility].   GEN: No acute illnesses, no fevers, chills. GI: No n/v/d, eating normally Pulm: No SOB Interactive and getting along well at home.  Otherwise, ROS is as per the HPI.  Objective:   BP (!) 151/104   Pulse 89   Temp 98.5 F (36.9 C) (Oral)   Ht 5\' 4"  (1.626 m)   Wt 233 lb 4 oz (105.8 kg)   BMI 40.04 kg/m   GEN: WDWN, NAD, Non-toxic, A & O x 3 HEENT: Atraumatic, Normocephalic. Neck supple. No masses, No LAD. Ears and Nose: No external deformity. CV: RRR, No M/G/R. No JVD. No thrill. No extra heart sounds. PULM: CTA B, no wheezes, crackles, rhonchi. No retractions. No resp. distress. No accessory muscle use. EXTR: No c/c/e NEURO Normal gait.  PSYCH: Normally interactive. Conversant. Not depressed or anxious appearing.  Calm demeanor.   Laboratory and Imaging Data: Results for orders placed or performed in visit on 02/14/16  CBC  Result Value Ref Range  WBC 6.0 4.0 - 10.5 K/uL   RBC 4.90 3.87 - 5.11 Mil/uL   Platelets 241.0 150.0 - 400.0 K/uL   Hemoglobin 14.4 12.0 - 15.0 g/dL   HCT 10.242.2 72.536.0 - 36.646.0 %   MCV 86.1 78.0 - 100.0 fl   MCHC 34.2 30.0 - 36.0 g/dL   RDW 44.013.6 34.711.5 - 42.515.5 %  Comprehensive metabolic panel  Result Value Ref Range   Sodium 140 135 - 145 mEq/L   Potassium 3.9 3.5 - 5.1 mEq/L   Chloride 104 96 - 112 mEq/L   CO2 31 19 - 32 mEq/L   Glucose, Bld 112 (H) 70 - 99 mg/dL   BUN 11 6 - 23 mg/dL   Creatinine, Ser 9.560.84 0.40 - 1.20 mg/dL   Total Bilirubin 0.4 0.2 - 1.2 mg/dL   Alkaline Phosphatase 85 39 - 117 U/L   AST 16 0 - 37 U/L   ALT 24 0 - 35 U/L   Total Protein 6.9 6.0 - 8.3 g/dL   Albumin 4.1 3.5 - 5.2 g/dL   Calcium 9.4 8.4 -  38.710.5 mg/dL   GFR 56.4376.98 >32.95>60.00 mL/min  Lipid panel  Result Value Ref Range   Cholesterol 183 0 - 200 mg/dL   Triglycerides 188.4190.0 (H) 0.0 - 149.0 mg/dL   HDL 16.6052.10 >63.01>39.00 mg/dL   VLDL 60.138.0 0.0 - 09.340.0 mg/dL   LDL Cholesterol 93 0 - 99 mg/dL   Total CHOL/HDL Ratio 4    NonHDL 131.05   Hemoglobin A1c  Result Value Ref Range   Hgb A1c MFr Bld 5.6 4.6 - 6.5 %  VITAMIN D 25 Hydroxy (Vit-D Deficiency, Fractures)  Result Value Ref Range   VITD 32.28 30.00 - 100.00 ng/mL    Assessment and Plan:   Essential hypertension  Morbid obesity (HCC)   New onset hypertension. Never been on medications before. We will start her on low-dose thiazide diuretic.  Morbid obesity. Body mass index is 40.04 kg/m.  She is going to work and do her best to lose weight with modified diet and activity levels.  Follow-up: Return in about 4 weeks (around 07/11/2016).  Future Appointments Date Time Provider Department Center  07/20/2016 1:00 PM Lorre MunroeBaity, Regina W, NP [facility]    Meds ordered this encounter  Medications  . hydrochlorothiazide (HYDRODIURIL) 12.5 MG tablet    Sig: Take 1 tablet (12.5 mg total) by mouth daily.    Dispense:  30 tablet    Refill:  3   Signed,  Dragon Thrush T. Chidubem Chaires, MD   Allergies as of 06/13/2016   No Known Allergies     Medication List       Accurate as of 06/13/16  4:41 PM. Always use your most recent med list.          ALPRAZolam 0.5 MG tablet Commonly known as:  XANAX Take 1 tablet (0.5 mg total) by mouth daily as needed for anxiety.   hydrochlorothiazide 12.5 MG tablet Commonly known as:  HYDRODIURIL Take 1 tablet (12.5 mg total) by mouth daily.   ibuprofen 200 MG tablet Commonly known as:  ADVIL,MOTRIN Take 200 mg by mouth as needed.   PROBIOTIC DAILY Caps Take 1 capsule by mouth daily.   SENNA PLUS PO Take 2 tablets by mouth 2 (two) times daily.   sertraline 100 MG tablet Commonly known as:  ZOLOFT Take 1 tablet (100 mg total) by mouth  daily.

## 2016-07-20 ENCOUNTER — Encounter: Payer: Self-pay | Admitting: Internal Medicine

## 2016-07-20 ENCOUNTER — Ambulatory Visit (INDEPENDENT_AMBULATORY_CARE_PROVIDER_SITE_OTHER): Payer: 59 | Admitting: Internal Medicine

## 2016-07-20 VITALS — BP 148/98 | HR 80 | Wt 236.4 lb

## 2016-07-20 DIAGNOSIS — I10 Essential (primary) hypertension: Secondary | ICD-10-CM | POA: Diagnosis not present

## 2016-07-20 LAB — BASIC METABOLIC PANEL
BUN: 11 mg/dL (ref 6–23)
CO2: 27 mEq/L (ref 19–32)
Calcium: 9.2 mg/dL (ref 8.4–10.5)
Chloride: 100 mEq/L (ref 96–112)
Creatinine, Ser: 0.84 mg/dL (ref 0.40–1.20)
GFR: 76.84 mL/min (ref >60.00)
Glucose, Bld: 118 mg/dL — ABNORMAL HIGH (ref 70–99)
Potassium: 3.8 mEq/L (ref 3.5–5.1)
Sodium: 137 mEq/L (ref 135–145)

## 2016-07-20 MED ORDER — LISINOPRIL-HYDROCHLOROTHIAZIDE 10-12.5 MG PO TABS: 1.0000 | ORAL_TABLET | Freq: Every day | ORAL | 0 refills | Status: DC

## 2016-07-20 NOTE — Progress Notes (Signed)
Subjective:    Patient ID: Alexis Berger, female    DOB: 27-Feb-1970, 46 y.o.   MRN: [id number]  HPI  Pt presents to the clinic today for follow up of HTN. She saw Dr. Patsy Lager 6/6. Her BP at that time was 151/104. He started her on HCTZ 12.5 mg daily. She has been taking the medication daily as prescribed. She denies adverse side effects. She has had some mild cramping and swelling in her legs. Her BP today is 148/98. There is no ECG on file.  Review of Systems      Past Medical History:  Diagnosis Date  . Frequent headaches     Current Outpatient Prescriptions  Medication Sig Dispense Refill  . ALPRAZolam (XANAX) 0.5 MG tablet Take 1 tablet (0.5 mg total) by mouth daily as needed for anxiety. 20 tablet 0  . hydrochlorothiazide (HYDRODIURIL) 12.5 MG tablet Take 1 tablet (12.5 mg total) by mouth daily. 30 tablet 3  . ibuprofen (ADVIL,MOTRIN) 200 MG tablet Take 200 mg by mouth as needed.    . Probiotic Product (PROBIOTIC DAILY) CAPS Take 1 capsule by mouth daily.    Bernadette Hoit Sodium (SENNA PLUS PO) Take 2 tablets by mouth 2 (two) times daily.    . sertraline (ZOLOFT) 100 MG tablet Take 1 tablet (100 mg total) by mouth daily. 30 tablet 2   No current facility-administered medications for this visit.     No Known Allergies  Family History  Problem Relation Age of Onset  . Hypertension Mother     Social History   Social History  . Marital status: Married    Spouse name: N/A  . Number of children: N/A  . Years of education: N/A   Occupational History  . Not on file.   Social History Main Topics  . Smoking status: Never Smoker  . Smokeless tobacco: Never Used  . Alcohol use No  . Drug use: No  . Sexual activity: Yes   Other Topics Concern  . Not on file   Social History Narrative  . No narrative on file     Constitutional: Denies fever, malaise, fatigue, headache or abrupt weight changes.  Respiratory: Denies difficulty breathing, shortness of  breath, cough or sputum production.   Cardiovascular: Pt reports swelling in BLE. Denies chest pain, chest tightness, palpitations or swelling in the hands.  Musculoskeletal: Pt reports muscle cramps BLE. Denies decrease in range of motion, difficulty with gait, muscle pain or joint pain.    No other specific complaints in a complete review of systems (except as listed in HPI above).  Objective:   Physical Exam   BP (!) 148/98   Pulse 80   Wt 236 lb 6.4 oz (107.2 kg)   SpO2 98%   BMI 40.58 kg/m  Wt Readings from Last 3 Encounters:  07/20/16 236 lb 6.4 oz (107.2 kg)  06/13/16 233 lb 4 oz (105.8 kg)  02/14/16 231 lb (104.8 kg)    General: Appears her stated age, obese in NAD. Cardiovascular: Normal rate and rhythm. S1,S2 noted.  No murmur, rubs or gallops noted. Trace BLE edema.  Pulmonary/Chest: Normal effort and positive vesicular breath sounds. No respiratory distress. No wheezes, rales or ronchi noted.  Neurological: Alert and oriented.    BMET    Component Value Date/Time   NA 140 02/14/2016 1025   K 3.9 02/14/2016 1025   CL 104 02/14/2016 1025   CO2 31 02/14/2016 1025   GLUCOSE 112 (H) 02/14/2016 1025  BUN 11 02/14/2016 1025   CREATININE 0.84 02/14/2016 1025   CALCIUM 9.4 02/14/2016 1025    Lipid Panel     Component Value Date/Time   CHOL 183 02/14/2016 1025   TRIG 190.0 (H) 02/14/2016 1025   HDL 52.10 02/14/2016 1025   CHOLHDL 4 02/14/2016 1025   VLDL 38.0 02/14/2016 1025   LDLCALC 93 02/14/2016 1025    CBC    Component Value Date/Time   WBC 6.0 02/14/2016 1025   RBC 4.90 02/14/2016 1025   HGB 14.4 02/14/2016 1025   HGB 8.3 (L) 03/26/2011 1155   HCT 42.2 02/14/2016 1025   HCT 31.5 (L) 03/27/2011 0559   PLT 241.0 02/14/2016 1025   MCV 86.1 02/14/2016 1025   MCHC 34.2 02/14/2016 1025   RDW 13.6 02/14/2016 1025    Hgb A1C Lab Results  Component Value Date   HGBA1C 5.6 02/14/2016           Assessment & Plan:

## 2016-07-20 NOTE — Patient Instructions (Signed)

## 2016-07-20 NOTE — Assessment & Plan Note (Signed)
Better but still not at goal  Stop HCTZ Start Lisinopril HCT, eRx sent to pharmacy Consume a low salt diet Encouraged exercise for weight loss

## 2016-08-09 ENCOUNTER — Ambulatory Visit (INDEPENDENT_AMBULATORY_CARE_PROVIDER_SITE_OTHER): Payer: 59 | Admitting: Internal Medicine

## 2016-08-09 ENCOUNTER — Encounter: Payer: Self-pay | Admitting: Internal Medicine

## 2016-08-09 ENCOUNTER — Ambulatory Visit: Payer: 59 | Admitting: Internal Medicine

## 2016-08-09 DIAGNOSIS — I10 Essential (primary) hypertension: Secondary | ICD-10-CM

## 2016-08-09 MED ORDER — LISINOPRIL-HYDROCHLOROTHIAZIDE 10-12.5 MG PO TABS: 1.0000 | ORAL_TABLET | Freq: Every day | ORAL | 5 refills | Status: DC

## 2016-08-09 NOTE — Patient Instructions (Signed)

## 2016-08-09 NOTE — Progress Notes (Signed)
Subjective:    Patient ID: Alexis Berger, female    DOB: [birth year], 46 y.o.   MRN: [id number]  HPI  Pt presents to the clinic today for 3 week follow up of HTN. Her HCTZ was changed to Lisinopril HCT at her last visit. She has been taking the medication as prescribed. She denies adverse side effects. Her BP today is 128/90. There is no ECG on file.  Review of Systems      Past Medical History:  Diagnosis Date  . Frequent headaches     Current Outpatient Prescriptions  Medication Sig Dispense Refill  . ALPRAZolam (XANAX) 0.5 MG tablet Take 1 tablet (0.5 mg total) by mouth daily as needed for anxiety. 20 tablet 0  . ibuprofen (ADVIL,MOTRIN) 200 MG tablet Take 200 mg by mouth as needed.    Marland Kitchen. lisinopril-hydrochlorothiazide (PRINZIDE,ZESTORETIC) 10-12.5 MG tablet Take 1 tablet by mouth daily. 30 tablet 0  . Probiotic Product (PROBIOTIC DAILY) CAPS Take 1 capsule by mouth daily.    Bernadette Hoit. Sennosides-Docusate Sodium (SENNA PLUS PO) Take 2 tablets by mouth 2 (two) times daily.    . sertraline (ZOLOFT) 100 MG tablet Take 1 tablet (100 mg total) by mouth daily. 30 tablet 2   No current facility-administered medications for this visit.     No Known Allergies  Family History  Problem Relation Age of Onset  . Hypertension Mother     Social History   Social History  . Marital status: Married    Spouse name: N/A  . Number of children: N/A  . Years of education: N/A   Occupational History  . Not on file.   Social History Main Topics  . Smoking status: Never Smoker  . Smokeless tobacco: Never Used  . Alcohol use No  . Drug use: No  . Sexual activity: Yes   Other Topics Concern  . Not on file   Social History Narrative  . No narrative on file     Constitutional: Denies fever, malaise, fatigue, headache or abrupt weight changes.  Respiratory: Denies difficulty breathing, shortness of breath, cough or sputum production.   Cardiovascular: Denies chest pain, chest tightness,  palpitations or swelling in the hands or feet.  Neurological: Denies dizziness, difficulty with memory, difficulty with speech or problems with balance and coordination.    No other specific complaints in a complete review of systems (except as listed in HPI above).  Objective:   Physical Exam   BP 128/90   Pulse 67   Temp 98 F (36.7 C) (Oral)   Wt 235 lb 12 oz (106.9 kg)   SpO2 98%   BMI 40.47 kg/m  Wt Readings from Last 3 Encounters:  08/09/16 235 lb 12 oz (106.9 kg)  07/20/16 236 lb 6.4 oz (107.2 kg)  06/13/16 233 lb 4 oz (105.8 kg)    General: Appears her stated age, obese in NAD. Cardiovascular: Normal rate and rhythm. S1,S2 noted.  No murmur, rubs or gallops noted.  Pulmonary/Chest: Normal effort and positive vesicular breath sounds. No respiratory distress. No wheezes, rales or ronchi noted.  Neurological: Alert and oriented.    BMET    Component Value Date/Time   NA 137 07/20/2016 1250   K 3.8 07/20/2016 1250   CL 100 07/20/2016 1250   CO2 27 07/20/2016 1250   GLUCOSE 118 (H) 07/20/2016 1250   BUN 11 07/20/2016 1250   CREATININE 0.84 07/20/2016 1250   CALCIUM 9.2 07/20/2016 1250    Lipid Panel  Component Value Date/Time   CHOL 183 02/14/2016 1025   TRIG 190.0 (H) 02/14/2016 1025   HDL 52.10 02/14/2016 1025   CHOLHDL 4 02/14/2016 1025   VLDL 38.0 02/14/2016 1025   LDLCALC 93 02/14/2016 1025    CBC    Component Value Date/Time   WBC 6.0 02/14/2016 1025   RBC 4.90 02/14/2016 1025   HGB 14.4 02/14/2016 1025   HGB 8.3 (L) 03/26/2011 1155   HCT 42.2 02/14/2016 1025   HCT 31.5 (L) 03/27/2011 0559   PLT 241.0 02/14/2016 1025   MCV 86.1 02/14/2016 1025   MCHC 34.2 02/14/2016 1025   RDW 13.6 02/14/2016 1025    Hgb A1C Lab Results  Component Value Date   HGBA1C 5.6 02/14/2016           Assessment & Plan:

## 2016-08-09 NOTE — Assessment & Plan Note (Signed)
Much better Continue Lisnopril HCT Refilled today  RTC in 6 months for your annual exam

## 2016-10-07 ENCOUNTER — Other Ambulatory Visit: Payer: Self-pay | Admitting: Internal Medicine

## 2016-10-09 NOTE — Telephone Encounter (Signed)
Last filled 05/08/16... Please advise 

## 2016-10-09 NOTE — Telephone Encounter (Signed)
Rx called in to pharmacy. 

## 2016-10-09 NOTE — Telephone Encounter (Signed)
Ok to phone in Xanax 

## 2017-01-14 ENCOUNTER — Other Ambulatory Visit: Payer: Self-pay | Admitting: Internal Medicine

## 2017-01-14 NOTE — Telephone Encounter (Signed)
Ok to phone in Xanax 

## 2017-01-14 NOTE — Telephone Encounter (Signed)
Last filled 10/09/16... Please advise 

## 2017-01-14 NOTE — Telephone Encounter (Signed)
Rx called in to requested pharmacy 

## 2017-01-31 ENCOUNTER — Other Ambulatory Visit: Payer: Self-pay | Admitting: Internal Medicine

## 2017-01-31 DIAGNOSIS — Z1231 Encounter for screening mammogram for malignant neoplasm of breast: Secondary | ICD-10-CM

## 2017-03-08 ENCOUNTER — Ambulatory Visit
Admission: RE | Admit: 2017-03-08 | Discharge: 2017-03-08 | Disposition: A | Discharge status: Home / Self Care | Payer: 59 | Source: Ambulatory Visit | Attending: Internal Medicine | Admitting: Internal Medicine

## 2017-03-08 DIAGNOSIS — Z1231 Encounter for screening mammogram for malignant neoplasm of breast: Secondary | ICD-10-CM | POA: Insufficient documentation

## 2017-04-07 ENCOUNTER — Other Ambulatory Visit: Payer: Self-pay | Admitting: Internal Medicine

## 2017-04-07 DIAGNOSIS — I10 Essential (primary) hypertension: Secondary | ICD-10-CM

## 2017-05-06 ENCOUNTER — Other Ambulatory Visit: Payer: Self-pay | Admitting: Internal Medicine

## 2017-05-06 DIAGNOSIS — I10 Essential (primary) hypertension: Secondary | ICD-10-CM

## 2017-05-06 MED ORDER — LISINOPRIL-HYDROCHLOROTHIAZIDE 10-12.5 MG PO TABS: 1.0000 | ORAL_TABLET | Freq: Every day | ORAL | 0 refills | Status: DC

## 2017-05-06 MED ORDER — ALPRAZOLAM 0.5 MG PO TABS: 0.5000 mg | ORAL_TABLET | Freq: Every day | ORAL | 0 refills | Status: DC | PRN

## 2017-05-06 NOTE — Telephone Encounter (Signed)
Xanax last filled 01/2017... Please advise... Pt has upcoming appt in May

## 2017-05-23 ENCOUNTER — Encounter: Payer: Self-pay | Admitting: Internal Medicine

## 2017-05-23 ENCOUNTER — Ambulatory Visit (INDEPENDENT_AMBULATORY_CARE_PROVIDER_SITE_OTHER): Payer: 59 | Admitting: Internal Medicine

## 2017-05-23 VITALS — BP 126/82 | HR 89 | Temp 98.1°F | Ht 64.0 in | Wt 243.0 lb

## 2017-05-23 DIAGNOSIS — F419 Anxiety disorder, unspecified: Secondary | ICD-10-CM | POA: Diagnosis not present

## 2017-05-23 DIAGNOSIS — F329 Major depressive disorder, single episode, unspecified: Secondary | ICD-10-CM | POA: Diagnosis not present

## 2017-05-23 DIAGNOSIS — E7800 Pure hypercholesterolemia, unspecified: Secondary | ICD-10-CM

## 2017-05-23 DIAGNOSIS — F32A Depression, unspecified: Secondary | ICD-10-CM

## 2017-05-23 DIAGNOSIS — Z0000 Encounter for general adult medical examination without abnormal findings: Secondary | ICD-10-CM | POA: Diagnosis not present

## 2017-05-23 DIAGNOSIS — R232 Flushing: Secondary | ICD-10-CM

## 2017-05-23 DIAGNOSIS — I10 Essential (primary) hypertension: Secondary | ICD-10-CM | POA: Diagnosis not present

## 2017-05-23 LAB — LIPID PANEL
Cholesterol: 202 mg/dL — ABNORMAL HIGH (ref 0–200)
HDL: 44.7 mg/dL (ref >39.00)
NonHDL: 157.76
Total CHOL/HDL Ratio: 5
Triglycerides: 343 mg/dL — ABNORMAL HIGH (ref 0.0–149.0)
VLDL: 68.6 mg/dL — ABNORMAL HIGH (ref 0.0–40.0)

## 2017-05-23 LAB — LUTEINIZING HORMONE: LH: 4.64 m[IU]/mL

## 2017-05-23 LAB — COMPREHENSIVE METABOLIC PANEL
ALT: 22 U/L (ref 0–35)
AST: 16 U/L (ref 0–37)
Albumin: 4 g/dL (ref 3.5–5.2)
Alkaline Phosphatase: 84 U/L (ref 39–117)
BUN: 15 mg/dL (ref 6–23)
CO2: 31 mEq/L (ref 19–32)
Calcium: 9.2 mg/dL (ref 8.4–10.5)
Chloride: 98 mEq/L (ref 96–112)
Creatinine, Ser: 0.77 mg/dL (ref 0.40–1.20)
GFR: 84.66 mL/min (ref >60.00)
Glucose, Bld: 124 mg/dL — ABNORMAL HIGH (ref 70–99)
Potassium: 3.5 mEq/L (ref 3.5–5.1)
Sodium: 137 mEq/L (ref 135–145)
Total Bilirubin: 0.4 mg/dL (ref 0.2–1.2)
Total Protein: 7.3 g/dL (ref 6.0–8.3)

## 2017-05-23 LAB — CBC
HCT: 40.3 % (ref 36.0–46.0)
Hemoglobin: 13.9 g/dL (ref 12.0–15.0)
MCHC: 34.5 g/dL (ref 30.0–36.0)
MCV: 87 fl (ref 78.0–100.0)
Platelets: 280 10*3/uL (ref 150.0–400.0)
RBC: 4.63 Mil/uL (ref 3.87–5.11)
RDW: 13.5 % (ref 11.5–15.5)
WBC: 7.9 10*3/uL (ref 4.0–10.5)

## 2017-05-23 LAB — VITAMIN D 25 HYDROXY (VIT D DEFICIENCY, FRACTURES): VITD: 34.35 ng/mL (ref 30.00–100.00)

## 2017-05-23 LAB — FOLLICLE STIMULATING HORMONE: FSH: 6.8 m[IU]/mL

## 2017-05-23 LAB — LDL CHOLESTEROL, DIRECT: Direct LDL: 126 mg/dL

## 2017-05-23 MED ORDER — LISINOPRIL-HYDROCHLOROTHIAZIDE 10-12.5 MG PO TABS: 1.0000 | ORAL_TABLET | Freq: Every day | ORAL | 3 refills | Status: DC

## 2017-05-23 MED ORDER — SERTRALINE HCL 100 MG PO TABS: 100.0000 mg | ORAL_TABLET | Freq: Every day | ORAL | 3 refills | Status: DC

## 2017-05-23 NOTE — Assessment & Plan Note (Signed)
Controlled on Lisinopril HCT, refilled today CBC and CMET today Reinforced DASH diet and exercise for weight loss

## 2017-05-23 NOTE — Assessment & Plan Note (Signed)
Chronic but stable on Sertraline and Xanax, Sertraline refilled today CSA and UDS today CMET today

## 2017-05-23 NOTE — Patient Instructions (Signed)

## 2017-05-23 NOTE — Progress Notes (Signed)
Subjective:    Patient ID: Alexis Berger, female    DOB: [birth year], 47 y.o.   MRN: [id number]  HPI  Pt presents to the clinic today for her annual exam. She is also due to follow up chronic conditions.  HTN: Her BP today is 126/82. She is taking Lisinopril HCT as prescribed. There is no ECG on file.  Anxiety and Depression. Chronic but stable on Sertraline and Xanax. She takes Xanax 1-2 times per week. There is no CSA and UDS on file.  Flu: never Tetanus: > 10 years ago Pap Smear: 07/2012, hysterectomy Mammogram: 03/2017 Vision Screening: annuallly Dentist: biannually  Diet: She does eat meat. She consumes more veggies than fruits. She does eat fried foods. She drinks mostly sweet tea. Exercise: None  Review of Systems  Past Medical History:  Diagnosis Date  . Frequent headaches     Current Outpatient Medications  Medication Sig Dispense Refill  . ALPRAZolam (XANAX) 0.5 MG tablet Take 1 tablet (0.5 mg total) by mouth daily as needed. 20 tablet 0  . ibuprofen (ADVIL,MOTRIN) 200 MG tablet Take 200 mg by mouth as needed.    Marland Kitchen lisinopril-hydrochlorothiazide (PRINZIDE,ZESTORETIC) 10-12.5 MG tablet Take 1 tablet by mouth daily. MUST SCHEDULE ANNUAL PHYSICAL 30 tablet 0  . Probiotic Product (PROBIOTIC DAILY) CAPS Take 1 capsule by mouth daily.    Bernadette Hoit Sodium (SENNA PLUS PO) Take 2 tablets by mouth 2 (two) times daily.    . sertraline (ZOLOFT) 100 MG tablet Take 1 tablet (100 mg total) by mouth daily. 30 tablet 2   No current facility-administered medications for this visit.     No Known Allergies  Family History  Problem Relation Age of Onset  . Hypertension Mother   . Breast cancer Neg Hx     Social History   Socioeconomic History  . Marital status: Married    Spouse name: Not on file  . Number of children: Not on file  . Years of education: Not on file  . Highest education level: Not on file  Occupational History  . Not on file  Social Needs  .  Financial resource strain: Not on file  . Food insecurity:    Worry: Not on file    Inability: Not on file  . Transportation needs:    Medical: Not on file    Non-medical: Not on file  Tobacco Use  . Smoking status: Never Smoker  . Smokeless tobacco: Never Used  Substance and Sexual Activity  . Alcohol use: No    Alcohol/week: 0.0 oz  . Drug use: No  . Sexual activity: Yes  Lifestyle  . Physical activity:    Days per week: Not on file    Minutes per session: Not on file  . Stress: Not on file  Relationships  . Social connections:    Talks on phone: Not on file    Gets together: Not on file    Attends religious service: Not on file    Active member of club or organization: Not on file    Attends meetings of clubs or organizations: Not on file    Relationship status: Not on file  . Intimate partner violence:    Fear of current or ex partner: Not on file    Emotionally abused: Not on file    Physically abused: Not on file    Forced sexual activity: Not on file  Other Topics Concern  . Not on file  Social History Narrative  .  Not on file     Constitutional: Denies fever, malaise, fatigue, headache or abrupt weight changes.  HEENT: Denies eye pain, eye redness, ear pain, ringing in the ears, wax buildup, runny nose, nasal congestion, bloody nose, or sore throat. Respiratory: Denies difficulty breathing, shortness of breath, cough or sputum production.   Cardiovascular: Denies chest pain, chest tightness, palpitations or swelling in the hands or feet.  Gastrointestinal: Denies abdominal pain, bloating, constipation, diarrhea or blood in the stool.  GU: Denies urgency, frequency, pain with urination, burning sensation, blood in urine, odor or discharge. Musculoskeletal: Denies decrease in range of motion, difficulty with gait, muscle pain or joint pain and swelling.  Skin: Denies redness, rashes, lesions or ulcercations.  Neurological: Pt reports hot flashes. Denies  dizziness, difficulty with memory, difficulty with speech or problems with balance and coordination.  Psych: Pt reports anxiety and depression. Denies SI/HI.  No other specific complaints in a complete review of systems (except as listed in HPI above).     Objective:   Physical Exam   BP 126/82   Pulse 89   Temp 98.1 F (36.7 C) (Oral)   Ht  (1.626 m)   Wt 243 lb (110.2 kg)   SpO2 98%   BMI 41.71 kg/m  Wt Readings from Last 3 Encounters:  05/23/17 243 lb (110.2 kg)  08/09/16 235 lb 12 oz (106.9 kg)  07/20/16 236 lb 6.4 oz (107.2 kg)    General: Appears her stated age, obese in NAD. Skin: Warm, dry and intact.  HEENT: Head: normal shape and size; Eyes: sclera white, no icterus, conjunctiva pink, PERRLA and EOMs intact; Ears: Tm's gray and intact, normal light reflex; ; Throat/Mouth: Teeth present, mucosa pink and moist, no exudate, lesions or ulcerations noted.  Neck:  Neck supple, trachea midline. No masses, lumps or thyromegaly present.  Cardiovascular: Normal rate and rhythm. S1,S2 noted.  No murmur, rubs or gallops noted. No JVD or BLE edema.  Pulmonary/Chest: Normal effort and positive vesicular breath sounds. No respiratory distress. No wheezes, rales or ronchi noted.  Abdomen: Soft and nontender. Normal bowel sounds. No distention or masses noted. Liver, spleen and kidneys non palpable. Musculoskeletal: Strength 5/5 BUE/BLE. No difficulty with gait.  Neurological: Alert and oriented. Cranial nerves II-XII grossly intact. Coordination normal.  Psychiatric: Mood and affect normal. Behavior is normal. Judgment and thought content normal.  :  BMET    Component Value Date/Time   NA 137 07/20/2016 1250   K 3.8 07/20/2016 1250   CL 100 07/20/2016 1250   CO2 27 07/20/2016 1250   GLUCOSE 118 (H) 07/20/2016 1250   BUN 11 07/20/2016 1250   CREATININE 0.84 07/20/2016 1250   CALCIUM 9.2 07/20/2016 1250    Lipid Panel     Component Value Date/Time   CHOL 183  02/14/2016 1025   TRIG 190.0 (H) 02/14/2016 1025   HDL 52.10 02/14/2016 1025   CHOLHDL 4 02/14/2016 1025   VLDL 38.0 02/14/2016 1025   LDLCALC 93 02/14/2016 1025    CBC    Component Value Date/Time   WBC 6.0 02/14/2016 1025   RBC 4.90 02/14/2016 1025   HGB 14.4 02/14/2016 1025   HGB 8.3 (L) 03/26/2011 1155   HCT 42.2 02/14/2016 1025   HCT 31.5 (L) 03/27/2011 0559   PLT 241.0 02/14/2016 1025   MCV 86.1 02/14/2016 1025   MCHC 34.2 02/14/2016 1025   RDW 13.6 02/14/2016 1025    Hgb A1C Lab Results  Component Value Date  HGBA1C 5.6 02/14/2016           Assessment & Plan:   Preventative Health Maintenance:  Encouraged her to get a flu shot in the fall She declines tetanus vaccine She declines pelvic exam today Mammogram UTD Will start colon cancer screening next year Encouraged her to consume a balanced diet and exercise regimen Advised her to see an eye doctor and dentist annually Will check CBC, CMET, Lipid and Vit D today  Hot Flashes:  FSH/LH today  RTC in 1 year, sooner if needed Nicki Reaper, NP

## 2017-05-26 LAB — PAIN MGMT, PROFILE 8 W/CONF, U
6 Acetylmorphine: NEGATIVE ng/mL (ref <10)
Alcohol Metabolites: NEGATIVE ng/mL (ref <500)
Alphahydroxyalprazolam: 36 ng/mL — ABNORMAL HIGH (ref <25)
Alphahydroxymidazolam: NEGATIVE ng/mL (ref <50)
Alphahydroxytriazolam: NEGATIVE ng/mL (ref <50)
Aminoclonazepam: NEGATIVE ng/mL (ref <25)
Amphetamines: NEGATIVE ng/mL (ref <500)
Benzodiazepines: POSITIVE ng/mL — AB (ref <100)
Buprenorphine, Urine: NEGATIVE ng/mL (ref <5)
Cocaine Metabolite: NEGATIVE ng/mL (ref <150)
Creatinine: 75.3 mg/dL
Hydroxyethylflurazepam: NEGATIVE ng/mL (ref <50)
Lorazepam: NEGATIVE ng/mL (ref <50)
MDMA: NEGATIVE ng/mL (ref <500)
Marijuana Metabolite: NEGATIVE ng/mL (ref <20)
Nordiazepam: NEGATIVE ng/mL (ref <50)
Opiates: NEGATIVE ng/mL (ref <100)
Oxazepam: NEGATIVE ng/mL (ref <50)
Oxidant: NEGATIVE ug/mL (ref <200)
Oxycodone: NEGATIVE ng/mL (ref <100)
Temazepam: NEGATIVE ng/mL (ref <50)
pH: 6.65 (ref 4.5–9.0)

## 2017-05-26 NOTE — Addendum Note (Signed)
Addended by: Lorre Munroe on: 05/26/2017 03:47 PM   Modules accepted: Orders

## 2017-08-15 ENCOUNTER — Other Ambulatory Visit: Payer: Self-pay | Admitting: Internal Medicine

## 2017-08-15 NOTE — Telephone Encounter (Signed)
Name of Medication: Xanax 0.5mg  Name of Pharmacy: [facility] Last Fill or Written Date and Quantity:  05/06/17 #20 Last Office Visit and Type: 05/23/17 Anxiety Next Office Visit and Type: 09/05/17 Last UDS/CSA: 05/23/2017

## 2017-09-05 ENCOUNTER — Other Ambulatory Visit (INDEPENDENT_AMBULATORY_CARE_PROVIDER_SITE_OTHER): Payer: 59

## 2017-09-05 ENCOUNTER — Other Ambulatory Visit: Payer: 59

## 2017-09-05 DIAGNOSIS — E7800 Pure hypercholesterolemia, unspecified: Secondary | ICD-10-CM

## 2017-09-05 LAB — LIPID PANEL
Cholesterol: 187 mg/dL (ref 0–200)
HDL: 52.2 mg/dL (ref >39.00)
LDL Cholesterol: 115 mg/dL — ABNORMAL HIGH (ref 0–99)
NonHDL: 134.3
Total CHOL/HDL Ratio: 4
Triglycerides: 96 mg/dL (ref 0.0–149.0)
VLDL: 19.2 mg/dL (ref 0.0–40.0)

## 2017-12-02 ENCOUNTER — Other Ambulatory Visit: Payer: Self-pay | Admitting: Internal Medicine

## 2017-12-04 NOTE — Telephone Encounter (Signed)
Last filled 08/15/2017, please advise... Note attached for pt to schedule CPE for further refills

## 2018-03-27 ENCOUNTER — Other Ambulatory Visit: Payer: Self-pay | Admitting: Internal Medicine

## 2018-03-28 NOTE — Telephone Encounter (Signed)
Last filled 11/2017, last OV 05/2017... please advise

## 2018-05-13 DIAGNOSIS — H66001 Acute suppurative otitis media without spontaneous rupture of ear drum, right ear: Secondary | ICD-10-CM | POA: Diagnosis not present

## 2018-05-13 DIAGNOSIS — H6123 Impacted cerumen, bilateral: Secondary | ICD-10-CM | POA: Diagnosis not present

## 2018-07-02 ENCOUNTER — Encounter: Payer: Self-pay | Admitting: Internal Medicine

## 2018-07-04 ENCOUNTER — Other Ambulatory Visit: Payer: Self-pay | Admitting: Internal Medicine

## 2018-07-04 DIAGNOSIS — I10 Essential (primary) hypertension: Secondary | ICD-10-CM

## 2018-07-04 NOTE — Telephone Encounter (Signed)
Last office visit 05/23/2017 for Anxiety and Depression.  Last refilled 03/28/2018 for #20 with no refills.  No future appointments.

## 2018-07-04 NOTE — Telephone Encounter (Signed)
Xanax last filled 03/28/18... I wanted to send pt msg letting her know she needs to schedule CPE, but I already forwarded a message to you yesterday about some concerns she has. I do not want to send her that with the previous msg not being addressed

## 2018-09-25 ENCOUNTER — Encounter: Payer: Self-pay | Admitting: Internal Medicine

## 2018-09-25 ENCOUNTER — Other Ambulatory Visit: Payer: Self-pay

## 2018-09-25 ENCOUNTER — Ambulatory Visit: Payer: 59 | Admitting: Internal Medicine

## 2018-09-25 VITALS — BP 138/92 | HR 79 | Temp 98.7°F | Wt 242.0 lb

## 2018-09-25 DIAGNOSIS — F329 Major depressive disorder, single episode, unspecified: Secondary | ICD-10-CM

## 2018-09-25 DIAGNOSIS — F32A Depression, unspecified: Secondary | ICD-10-CM

## 2018-09-25 DIAGNOSIS — F419 Anxiety disorder, unspecified: Secondary | ICD-10-CM | POA: Diagnosis not present

## 2018-09-25 MED ORDER — ALPRAZOLAM 0.5 MG PO TABS: 0.5000 mg | ORAL_TABLET | Freq: Every day | ORAL | 0 refills | Status: DC | PRN

## 2018-09-25 MED ORDER — SERTRALINE HCL 100 MG PO TABS: 200.0000 mg | ORAL_TABLET | Freq: Every day | ORAL | 0 refills | Status: DC

## 2018-09-25 MED ORDER — BUSPIRONE HCL 5 MG PO TABS: 5.0000 mg | ORAL_TABLET | Freq: Two times a day (BID) | ORAL | 2 refills | Status: DC

## 2018-09-25 NOTE — Patient Instructions (Signed)

## 2018-09-25 NOTE — Progress Notes (Signed)
Subjective:    Patient ID: Alexis Berger, female    DOB: May 13, 1970, 48 y.o.   MRN: [id number]  HPI  Pt presents to the clinic today with c/o worsening anxiety and depression. She noticed this over the last 2-3 months. She reports increased stress at home. She is caring for her dad with Alzheimer's, she has had 2 deaths in the family. She has had trouble falling and staying asleep. She is crying for no reason. She is taking Sertraline 100 mg daily and Xanax 2-3 times per month. She is not currently seeing a therapist.  Review of Systems  Past Medical History:  Diagnosis Date  . Frequent headaches     Current Outpatient Medications  Medication Sig Dispense Refill  . ALPRAZolam (XANAX) 0.5 MG tablet TAKE 1 TABLET BY MOUTH DAILY AS NEEDED FOR ANXIETY 20 tablet 0  . ibuprofen (ADVIL,MOTRIN) 200 MG tablet Take 200 mg by mouth as needed.    Marland Kitchen. lisinopril-hydrochlorothiazide (ZESTORETIC) 10-12.5 MG tablet Take 1 tablet by mouth daily. MUST SCHEDULE PHYSICAL EXAM 90 tablet 0  . Probiotic Product (PROBIOTIC DAILY) CAPS Take 1 capsule by mouth daily.    Bernadette Hoit. Sennosides-Docusate Sodium (SENNA PLUS PO) Take 2 tablets by mouth 2 (two) times daily.    . sertraline (ZOLOFT) 100 MG tablet Take 1 tablet (100 mg total) by mouth daily. 90 tablet 3   No current facility-administered medications for this visit.     No Known Allergies  Family History  Problem Relation Age of Onset  . Hypertension Mother   . Breast cancer Neg Hx     Social History   Socioeconomic History  . Marital status: Married    Spouse name: Not on file  . Number of children: Not on file  . Years of education: Not on file  . Highest education level: Not on file  Occupational History  . Not on file  Social Needs  . Financial resource strain: Not on file  . Food insecurity    Worry: Not on file    Inability: Not on file  . Transportation needs    Medical: Not on file    Non-medical: Not on file  Tobacco Use  .  Smoking status: Never Smoker  . Smokeless tobacco: Never Used  Substance and Sexual Activity  . Alcohol use: No    Alcohol/week: 0.0 standard drinks  . Drug use: No  . Sexual activity: Yes  Lifestyle  . Physical activity    Days per week: Not on file    Minutes per session: Not on file  . Stress: Not on file  Relationships  . Social Musicianconnections    Talks on phone: Not on file    Gets together: Not on file    Attends religious service: Not on file    Active member of club or organization: Not on file    Attends meetings of clubs or organizations: Not on file    Relationship status: Not on file  . Intimate partner violence    Fear of current or ex partner: Not on file    Emotionally abused: Not on file    Physically abused: Not on file    Forced sexual activity: Not on file  Other Topics Concern  . Not on file  Social History Narrative  . Not on file     Constitutional: Denies fever, malaise, fatigue, headache or abrupt weight changes.  Neurological: Pt reports insomnia. Denies dizziness, difficulty with memory, difficulty with speech or problems  with balance and coordination.  Psych: Pt reports anxiety and depression. Denies SI/HI.  No other specific complaints in a complete review of systems (except as listed in HPI above).     Objective:   Physical Exam  BP (!) 138/92   Pulse 79   Temp 98.7 F (37.1 C) (Temporal)   Wt 242 lb (109.8 kg)   SpO2 98%   BMI 41.54 kg/m  Wt Readings from Last 3 Encounters:  09/25/18 242 lb (109.8 kg)  05/23/17 243 lb (110.2 kg)  08/09/16 235 lb 12 oz (106.9 kg)    General: Appears her stated age, obese in NAD. Neurological: Alert and oriented.  Psychiatric: Mood and affect tearful. Behavior is normal. Judgment and thought content normal.     BMET    Component Value Date/Time   NA 137 05/23/2017 1239   K 3.5 05/23/2017 1239   CL 98 05/23/2017 1239   CO2 31 05/23/2017 1239   GLUCOSE 124 (H) 05/23/2017 1239   BUN 15  05/23/2017 1239   CREATININE 0.77 05/23/2017 1239   CALCIUM 9.2 05/23/2017 1239    Lipid Panel     Component Value Date/Time   CHOL 187 09/05/2017 1104   TRIG 96.0 09/05/2017 1104   HDL 52.20 09/05/2017 1104   CHOLHDL 4 09/05/2017 1104   VLDL 19.2 09/05/2017 1104   LDLCALC 115 (H) 09/05/2017 1104    CBC    Component Value Date/Time   WBC 7.9 05/23/2017 1239   RBC 4.63 05/23/2017 1239   HGB 13.9 05/23/2017 1239   HGB 8.3 (L) 03/26/2011 1155   HCT 40.3 05/23/2017 1239   HCT 31.5 (L) 03/27/2011 0559   PLT 280.0 05/23/2017 1239   MCV 87.0 05/23/2017 1239   MCHC 34.5 05/23/2017 1239   RDW 13.5 05/23/2017 1239    Hgb A1C Lab Results  Component Value Date   HGBA1C 5.6 02/14/2016            Assessment & Plan:   Anxiety and Depression:  Support offered today Increase Sertraline to 200 mg daily RX for Buspar 5 mg BID Xanax refilled today  RTC in 1 month, sooner if needed Webb Silversmith, NP

## 2018-09-26 ENCOUNTER — Ambulatory Visit: Payer: 59 | Admitting: Internal Medicine

## 2018-10-28 ENCOUNTER — Ambulatory Visit (INDEPENDENT_AMBULATORY_CARE_PROVIDER_SITE_OTHER): Payer: 59 | Admitting: Internal Medicine

## 2018-10-28 ENCOUNTER — Encounter: Payer: Self-pay | Admitting: Internal Medicine

## 2018-10-28 ENCOUNTER — Other Ambulatory Visit: Payer: Self-pay | Admitting: Internal Medicine

## 2018-10-28 ENCOUNTER — Other Ambulatory Visit: Payer: Self-pay

## 2018-10-28 VITALS — BP 128/82 | HR 80 | Temp 97.3°F | Ht 63.75 in | Wt 238.0 lb

## 2018-10-28 DIAGNOSIS — Z0000 Encounter for general adult medical examination without abnormal findings: Secondary | ICD-10-CM | POA: Diagnosis not present

## 2018-10-28 DIAGNOSIS — R232 Flushing: Secondary | ICD-10-CM | POA: Diagnosis not present

## 2018-10-28 DIAGNOSIS — F419 Anxiety disorder, unspecified: Secondary | ICD-10-CM | POA: Diagnosis not present

## 2018-10-28 DIAGNOSIS — Z79899 Other long term (current) drug therapy: Secondary | ICD-10-CM

## 2018-10-28 DIAGNOSIS — Z1231 Encounter for screening mammogram for malignant neoplasm of breast: Secondary | ICD-10-CM

## 2018-10-28 DIAGNOSIS — F329 Major depressive disorder, single episode, unspecified: Secondary | ICD-10-CM

## 2018-10-28 DIAGNOSIS — I10 Essential (primary) hypertension: Secondary | ICD-10-CM

## 2018-10-28 DIAGNOSIS — F32A Depression, unspecified: Secondary | ICD-10-CM

## 2018-10-28 NOTE — Progress Notes (Signed)
Subjective:    Patient ID: Alexis Berger, female    DOB: May 01, 1970, 48 y.o.   MRN: [id number]  HPI  Pt presents to the clinic today for her annual exam. She is also due to follow up chronic conditions.  Anxiety and Depression: Managed on Sertraline, Buspar and Xanax. She is not currently seeing a therapist. She denies SI/HI.  HTN: Her BP today is 128/82. She is taking Lisinopril HCT as prescribed. There is no ECG on file.  Flu: never Tetanus: > 10 years ago Pap Smear: 07/2012, partial hysterectomy Mammogram: 03/2017 Colon Screening: never Vision Screening: annually Dentist: biannually  Diet: She does eat meat. She consumes fruits and veggies daily. She does eat some fried foods. She drinks mostly coffee, water, tea. Exercise: Walking  Review of Systems      Past Medical History:  Diagnosis Date  . Frequent headaches     Current Outpatient Medications  Medication Sig Dispense Refill  . ALPRAZolam (XANAX) 0.5 MG tablet Take 1 tablet (0.5 mg total) by mouth daily as needed. for anxiety 20 tablet 0  . busPIRone (BUSPAR) 5 MG tablet Take 1 tablet (5 mg total) by mouth 2 (two) times daily. 60 tablet 2  . ibuprofen (ADVIL,MOTRIN) 200 MG tablet Take 200 mg by mouth as needed.    Marland Kitchen lisinopril-hydrochlorothiazide (ZESTORETIC) 10-12.5 MG tablet Take 1 tablet by mouth daily. MUST SCHEDULE PHYSICAL EXAM 90 tablet 0  . Probiotic Product (PROBIOTIC DAILY) CAPS Take 1 capsule by mouth daily.    Bernadette Hoit Sodium (SENNA PLUS PO) Take 2 tablets by mouth 2 (two) times daily.    . sertraline (ZOLOFT) 100 MG tablet Take 2 tablets (200 mg total) by mouth daily. 180 tablet 0   No current facility-administered medications for this visit.     No Known Allergies  Family History  Problem Relation Age of Onset  . Hypertension Mother   . Breast cancer Neg Hx     Social History   Socioeconomic History  . Marital status: Married    Spouse name: Not on file  . Number of  children: Not on file  . Years of education: Not on file  . Highest education level: Not on file  Occupational History  . Not on file  Social Needs  . Financial resource strain: Not on file  . Food insecurity    Worry: Not on file    Inability: Not on file  . Transportation needs    Medical: Not on file    Non-medical: Not on file  Tobacco Use  . Smoking status: Never Smoker  . Smokeless tobacco: Never Used  Substance and Sexual Activity  . Alcohol use: No    Alcohol/week: 0.0 standard drinks  . Drug use: No  . Sexual activity: Yes  Lifestyle  . Physical activity    Days per week: Not on file    Minutes per session: Not on file  . Stress: Not on file  Relationships  . Social Musician on phone: Not on file    Gets together: Not on file    Attends religious service: Not on file    Active member of club or organization: Not on file    Attends meetings of clubs or organizations: Not on file    Relationship status: Not on file  . Intimate partner violence    Fear of current or ex partner: Not on file    Emotionally abused: Not on file  Physically abused: Not on file    Forced sexual activity: Not on file  Other Topics Concern  . Not on file  Social History Narrative  . Not on file     Constitutional: Denies fever, malaise, fatigue, headache or abrupt weight changes.  HEENT: Denies eye pain, eye redness, ear pain, ringing in the ears, wax buildup, runny nose, nasal congestion, bloody nose, or sore throat. Respiratory: Denies difficulty breathing, shortness of breath, cough or sputum production.   Cardiovascular: Denies chest pain, chest tightness, palpitations or swelling in the hands or feet.  Gastrointestinal: Denies abdominal pain, bloating, constipation, diarrhea or blood in the stool.  GU: Denies urgency, frequency, pain with urination, burning sensation, blood in urine, odor or discharge. Musculoskeletal: Denies decrease in range of motion, difficulty  with gait, muscle pain or joint pain and swelling.  Skin: Denies redness, rashes, lesions or ulcercations.  Neurological: Pt reports hot flashes. Denies dizziness, difficulty with memory, difficulty with speech or problems with balance and coordination.  Psych: Pt has a history of anxiety and depression. Denies SI/HI.  No other specific complaints in a complete review of systems (except as listed in HPI above).  Objective:   Physical Exam  BP 128/82   Pulse 80   Temp (!) 97.3 F (36.3 C) (Temporal)   Ht 5' 3.75" (1.619 m)   Wt 238 lb (108 kg)   SpO2 98%   BMI 41.17 kg/m  Wt Readings from Last 3 Encounters:  10/28/18 238 lb (108 kg)  09/25/18 242 lb (109.8 kg)  05/23/17 243 lb (110.2 kg)    General: Appears her stated age, obese, in NAD. Skin: Warm, dry and intact. No rashes noted. HEENT: Head: normal shape and size; Eyes: sclera white, no icterus, conjunctiva pink, PERRLA and EOMs intact; Ears: Tm's gray and intact, normal light reflex; Neck:  Neck supple, trachea midline. No masses, lumps or thyromegaly present.  Cardiovascular: Normal rate and rhythm. S1,S2 noted.  No murmur, rubs or gallops noted. No JVD or BLE edema. No carotid bruits noted. Pulmonary/Chest: Normal effort and positive vesicular breath sounds. No respiratory distress. No wheezes, rales or ronchi noted.  Abdomen: Soft and nontender. Normal bowel sounds. No distention or masses noted. Liver, spleen and kidneys non palpable. Musculoskeletal: Strength 5/5 BUE/BLE. No difficulty with gait.  Neurological: Alert and oriented. Cranial nerves II-XII grossly intact. Coordination normal.  Psychiatric: Mood and affect normal. Behavior is normal. Judgment and thought content normal.     BMET    Component Value Date/Time   NA 137 05/23/2017 1239   K 3.5 05/23/2017 1239   CL 98 05/23/2017 1239   CO2 31 05/23/2017 1239   GLUCOSE 124 (H) 05/23/2017 1239   BUN 15 05/23/2017 1239   CREATININE 0.77 05/23/2017 1239    CALCIUM 9.2 05/23/2017 1239    Lipid Panel     Component Value Date/Time   CHOL 187 09/05/2017 1104   TRIG 96.0 09/05/2017 1104   HDL 52.20 09/05/2017 1104   CHOLHDL 4 09/05/2017 1104   VLDL 19.2 09/05/2017 1104   LDLCALC 115 (H) 09/05/2017 1104    CBC    Component Value Date/Time   WBC 7.9 05/23/2017 1239   RBC 4.63 05/23/2017 1239   HGB 13.9 05/23/2017 1239   HGB 8.3 (L) 03/26/2011 1155   HCT 40.3 05/23/2017 1239   HCT 31.5 (L) 03/27/2011 0559   PLT 280.0 05/23/2017 1239   MCV 87.0 05/23/2017 1239   MCHC 34.5 05/23/2017 1239   RDW  13.5 05/23/2017 1239    Hgb A1C Lab Results  Component Value Date   HGBA1C 5.6 02/14/2016            Assessment & Plan:   Preventative Health Maintenance:  She declines flu shot  She declines tetanus booster She declines pelvic exam today, does not need pap smears She will call to schedule her mammogram She declines referral to GI for screening colonoscopy but is agreeable to Cologuard- will discuss coverage with her insurance company and get back to me Encouraged her to consume a balanced diet and exercise regimen Advised her to see an eye doctor and dentist annually Will check CBC, CMET, TSH, Lipid, A1C and Vit D today  Hot Flashes:  Will check FSH/LH today Can try Black Cohosh or Estroven OTC  RTC in 1 year, sooner if needed Nicki Reaperegina Sheliah Fiorillo, NP

## 2018-10-28 NOTE — Patient Instructions (Signed)
Health Maintenance, Female Adopting a healthy lifestyle and getting preventive care are important in promoting health and wellness. Ask your health care provider about:  The right schedule for you to have regular tests and exams.  Things you can do on your own to prevent diseases and keep yourself healthy. What should I know about diet, weight, and exercise? Eat a healthy diet   Eat a diet that includes plenty of vegetables, fruits, low-fat dairy products, and lean protein.  Do not eat a lot of foods that are high in solid fats, added sugars, or sodium. Maintain a healthy weight Body mass index (BMI) is used to identify weight problems. It estimates body fat based on height and weight. Your health care provider can help determine your BMI and help you achieve or maintain a healthy weight. Get regular exercise Get regular exercise. This is one of the most important things you can do for your health. Most adults should:  Exercise for at least 150 minutes each week. The exercise should increase your heart rate and make you sweat (moderate-intensity exercise).  Do strengthening exercises at least twice a week. This is in addition to the moderate-intensity exercise.  Spend less time sitting. Even light physical activity can be beneficial. Watch cholesterol and blood lipids Have your blood tested for lipids and cholesterol at 50 years of age, then have this test every 5 years. Have your cholesterol levels checked more often if:  Your lipid or cholesterol levels are high.  You are older than 50 years of age.  You are at high risk for heart disease. What should I know about cancer screening? Depending on your health history and family history, you may need to have cancer screening at various ages. This may include screening for:  Breast cancer.  Cervical cancer.  Colorectal cancer.  Skin cancer.  Lung cancer. What should I know about heart disease, diabetes, and high blood  pressure? Blood pressure and heart disease  High blood pressure causes heart disease and increases the risk of stroke. This is more likely to develop in people who have high blood pressure readings, are of African descent, or are overweight.  Have your blood pressure checked: ? Every 3-5 years if you are 18-39 years of age. ? Every year if you are 40 years old or older. Diabetes Have regular diabetes screenings. This checks your fasting blood sugar level. Have the screening done:  Once every three years after age 40 if you are at a normal weight and have a low risk for diabetes.  More often and at a younger age if you are overweight or have a high risk for diabetes. What should I know about preventing infection? Hepatitis B If you have a higher risk for hepatitis B, you should be screened for this virus. Talk with your health care provider to find out if you are at risk for hepatitis B infection. Hepatitis C Testing is recommended for:  Everyone born from 1945 through 1965.  Anyone with known risk factors for hepatitis C. Sexually transmitted infections (STIs)  Get screened for STIs, including gonorrhea and chlamydia, if: ? You are sexually active and are younger than 50 years of age. ? You are older than 50 years of age and your health care provider tells you that you are at risk for this type of infection. ? Your sexual activity has changed since you were last screened, and you are at increased risk for chlamydia or gonorrhea. Ask your health care provider if   you are at risk.  Ask your health care provider about whether you are at high risk for HIV. Your health care provider may recommend a prescription medicine to help prevent HIV infection. If you choose to take medicine to prevent HIV, you should first get tested for HIV. You should then be tested every 3 months for as long as you are taking the medicine. Pregnancy  If you are about to stop having your period (premenopausal) and  you may become pregnant, seek counseling before you get pregnant.  Take 400 to 800 micrograms (mcg) of folic acid every day if you become pregnant.  Ask for birth control (contraception) if you want to prevent pregnancy. Osteoporosis and menopause Osteoporosis is a disease in which the bones lose minerals and strength with aging. This can result in bone fractures. If you are 65 years old or older, or if you are at risk for osteoporosis and fractures, ask your health care provider if you should:  Be screened for bone loss.  Take a calcium or vitamin D supplement to lower your risk of fractures.  Be given hormone replacement therapy (HRT) to treat symptoms of menopause. Follow these instructions at home: Lifestyle  Do not use any products that contain nicotine or tobacco, such as cigarettes, e-cigarettes, and chewing tobacco. If you need help quitting, ask your health care provider.  Do not use street drugs.  Do not share needles.  Ask your health care provider for help if you need support or information about quitting drugs. Alcohol use  Do not drink alcohol if: ? Your health care provider tells you not to drink. ? You are pregnant, may be pregnant, or are planning to become pregnant.  If you drink alcohol: ? Limit how much you use to 0-1 drink a day. ? Limit intake if you are breastfeeding.  Be aware of how much alcohol is in your drink. In the [place] one drink equals one 12 oz bottle of beer (355 mL), one 5 oz glass of wine (148 mL), or one 1 oz glass of hard liquor (44 mL). General instructions  Schedule regular health, dental, and eye exams.  Stay current with your vaccines.  Tell your health care provider if: ? You often feel depressed. ? You have ever been abused or do not feel safe at home. Summary  Adopting a healthy lifestyle and getting preventive care are important in promoting health and wellness.  Follow your health care provider's instructions about healthy  diet, exercising, and getting tested or screened for diseases.  Follow your health care provider's instructions on monitoring your cholesterol and blood pressure. This information is not intended to replace advice given to you by your health care provider. Make sure you discuss any questions you have with your health care provider. Document Released: 07/10/2010 Document Revised: 12/18/2017 Document Reviewed: 12/18/2017 Elsevier Patient Education  2020 Elsevier Inc[employer]  

## 2018-10-29 LAB — COMPREHENSIVE METABOLIC PANEL
ALT: 26 U/L (ref 0–35)
AST: 21 U/L (ref 0–37)
Albumin: 4.4 g/dL (ref 3.5–5.2)
Alkaline Phosphatase: 132 U/L — ABNORMAL HIGH (ref 39–117)
BUN: 14 mg/dL (ref 6–23)
CO2: 33 mEq/L — ABNORMAL HIGH (ref 19–32)
Calcium: 9.7 mg/dL (ref 8.4–10.5)
Chloride: 94 mEq/L — ABNORMAL LOW (ref 96–112)
Creatinine, Ser: 0.9 mg/dL (ref 0.40–1.20)
GFR: 66.14 mL/min (ref >60.00)
Glucose, Bld: 95 mg/dL (ref 70–99)
Potassium: 3.7 mEq/L (ref 3.5–5.1)
Sodium: 136 mEq/L (ref 135–145)
Total Bilirubin: 0.4 mg/dL (ref 0.2–1.2)
Total Protein: 7.4 g/dL (ref 6.0–8.3)

## 2018-10-29 LAB — PAIN MGMT, PROFILE 8 W/CONF, U
6 Acetylmorphine: NEGATIVE ng/mL
Alcohol Metabolites: NEGATIVE ng/mL (ref <500)
Amphetamines: NEGATIVE ng/mL
Benzodiazepines: NEGATIVE ng/mL
Buprenorphine, Urine: NEGATIVE ng/mL
Cocaine Metabolite: NEGATIVE ng/mL
Creatinine: 113.2 mg/dL
MDMA: NEGATIVE ng/mL
Marijuana Metabolite: NEGATIVE ng/mL
Opiates: NEGATIVE ng/mL
Oxidant: NEGATIVE ug/mL
Oxycodone: NEGATIVE ng/mL
pH: 5.6 (ref 4.5–9.0)

## 2018-10-29 LAB — LIPID PANEL
Cholesterol: 231 mg/dL — ABNORMAL HIGH (ref 0–200)
HDL: 47.5 mg/dL (ref >39.00)
NonHDL: 183.56
Total CHOL/HDL Ratio: 5
Triglycerides: 335 mg/dL — ABNORMAL HIGH (ref 0.0–149.0)
VLDL: 67 mg/dL — ABNORMAL HIGH (ref 0.0–40.0)

## 2018-10-29 LAB — TSH: TSH: 2.15 u[IU]/mL (ref 0.35–4.50)

## 2018-10-29 LAB — LDL CHOLESTEROL, DIRECT: Direct LDL: 133 mg/dL

## 2018-10-29 LAB — CBC
HCT: 44.7 % (ref 36.0–46.0)
Hemoglobin: 14.8 g/dL (ref 12.0–15.0)
MCHC: 33.1 g/dL (ref 30.0–36.0)
MCV: 86.9 fl (ref 78.0–100.0)
Platelets: 268 10*3/uL (ref 150.0–400.0)
RBC: 5.14 Mil/uL — ABNORMAL HIGH (ref 3.87–5.11)
RDW: 13.4 % (ref 11.5–15.5)
WBC: 11.3 10*3/uL — ABNORMAL HIGH (ref 4.0–10.5)

## 2018-10-29 LAB — FOLLICLE STIMULATING HORMONE: FSH: 9.5 m[IU]/mL

## 2018-10-29 LAB — HEMOGLOBIN A1C: Hgb A1c MFr Bld: 5.9 % (ref 4.6–6.5)

## 2018-10-29 LAB — VITAMIN D 25 HYDROXY (VIT D DEFICIENCY, FRACTURES): VITD: 28.05 ng/mL — ABNORMAL LOW (ref 30.00–100.00)

## 2018-10-29 LAB — LUTEINIZING HORMONE: LH: 12.69 m[IU]/mL

## 2018-11-03 ENCOUNTER — Encounter: Payer: Self-pay | Admitting: Internal Medicine

## 2018-11-03 NOTE — Assessment & Plan Note (Signed)
Controlled on Lisinopril HCT °CMET today °Reinforced DASH diet and exercise for weight loss °

## 2018-11-03 NOTE — Assessment & Plan Note (Signed)
CSA and UDS today Continue Sertraline and Buspar Discussed uses of Xanax rarely as needed Will monitor

## 2018-11-18 ENCOUNTER — Encounter: Payer: Self-pay | Admitting: Internal Medicine

## 2018-12-08 ENCOUNTER — Other Ambulatory Visit: Payer: Self-pay

## 2018-12-08 DIAGNOSIS — Z20822 Contact with and (suspected) exposure to covid-19: Secondary | ICD-10-CM

## 2018-12-10 ENCOUNTER — Telehealth: Payer: Self-pay | Admitting: *Deleted

## 2018-12-10 ENCOUNTER — Telehealth: Payer: Self-pay

## 2018-12-10 NOTE — Telephone Encounter (Signed)
Patient called and was given negative covid results . 

## 2018-12-10 NOTE — Telephone Encounter (Signed)
Patient called in requesting [facility] lab results  - DOB/Address verified - results pending. Reviewed testing process with patient, no further process.

## 2018-12-11 LAB — NOVEL CORONAVIRUS, NAA: SARS-CoV-2, NAA: NOT DETECTED

## 2018-12-24 ENCOUNTER — Other Ambulatory Visit: Payer: Self-pay | Admitting: Internal Medicine

## 2019-01-16 ENCOUNTER — Ambulatory Visit
Admission: RE | Admit: 2019-01-16 | Discharge: 2019-01-16 | Disposition: A | Discharge status: Home / Self Care | Payer: 59 | Source: Ambulatory Visit | Attending: Internal Medicine | Admitting: Internal Medicine

## 2019-01-16 DIAGNOSIS — Z1231 Encounter for screening mammogram for malignant neoplasm of breast: Secondary | ICD-10-CM

## 2019-01-19 ENCOUNTER — Other Ambulatory Visit: Payer: Self-pay | Admitting: Internal Medicine

## 2019-01-22 NOTE — Telephone Encounter (Signed)
Last filled 09/25/2018... please advise  

## 2019-03-20 ENCOUNTER — Other Ambulatory Visit: Payer: Self-pay | Admitting: Internal Medicine

## 2019-03-20 NOTE — Telephone Encounter (Signed)
Last filled 01/22/2019.... please advise 

## 2019-05-16 ENCOUNTER — Other Ambulatory Visit: Payer: Self-pay | Admitting: Internal Medicine

## 2019-05-16 DIAGNOSIS — I10 Essential (primary) hypertension: Secondary | ICD-10-CM

## 2019-06-26 ENCOUNTER — Other Ambulatory Visit: Payer: Self-pay | Admitting: Internal Medicine

## 2019-11-19 ENCOUNTER — Other Ambulatory Visit: Payer: Self-pay | Admitting: Internal Medicine

## 2019-11-20 NOTE — Telephone Encounter (Signed)
Last filled 03/23/2019...Marland Kitchen please advise--- I will send note for pt to schedule CPE

## 2019-12-11 ENCOUNTER — Encounter: Payer: Self-pay | Admitting: Internal Medicine

## 2019-12-11 ENCOUNTER — Ambulatory Visit (INDEPENDENT_AMBULATORY_CARE_PROVIDER_SITE_OTHER): Payer: 59 | Admitting: Internal Medicine

## 2019-12-11 ENCOUNTER — Other Ambulatory Visit: Payer: Self-pay

## 2019-12-11 VITALS — BP 128/84 | HR 93 | Temp 97.6°F | Ht 63.5 in | Wt 253.0 lb

## 2019-12-11 DIAGNOSIS — Z1211 Encounter for screening for malignant neoplasm of colon: Secondary | ICD-10-CM | POA: Diagnosis not present

## 2019-12-11 DIAGNOSIS — Z79899 Other long term (current) drug therapy: Secondary | ICD-10-CM

## 2019-12-11 DIAGNOSIS — Z114 Encounter for screening for human immunodeficiency virus [HIV]: Secondary | ICD-10-CM

## 2019-12-11 DIAGNOSIS — F419 Anxiety disorder, unspecified: Secondary | ICD-10-CM

## 2019-12-11 DIAGNOSIS — F32A Depression, unspecified: Secondary | ICD-10-CM

## 2019-12-11 DIAGNOSIS — I10 Essential (primary) hypertension: Secondary | ICD-10-CM

## 2019-12-11 DIAGNOSIS — Z0001 Encounter for general adult medical examination with abnormal findings: Secondary | ICD-10-CM | POA: Diagnosis not present

## 2019-12-11 DIAGNOSIS — Z1159 Encounter for screening for other viral diseases: Secondary | ICD-10-CM

## 2019-12-11 NOTE — Assessment & Plan Note (Signed)
Continue Sertraline, Buspar and Xanax CSA and UDS today Support offered today

## 2019-12-11 NOTE — Assessment & Plan Note (Signed)
Reasonable control on Lisinopril HCT, continue CMET today Reinforced DASH diet and exercise for weight loss Will monitor

## 2019-12-11 NOTE — Progress Notes (Signed)
Subjective:    Patient ID: Alexis Berger, female    DOB: 1970/08/28, 49 y.o.   MRN: [id number]  HPI  Pt presents to the clinic today for her annual exam. She is also due to follow up chronic conditions.  HTN: Her BP today is 128/84. She is taking Lisinopril HCT as prescribed. Her last creatinine was 0.9, potassium 3., 10/2018. There is no ECG on file.  Anxiety and Depression: Persistent, managed on Sertraline and Buspar. She takes Xanax about 1 x week. CSA/UDS last done 10/2018. She is not currently seeing a therapist. She denies SI/HI.  Flu: never Tetanus: > 10 years ago Covid: never Pap Smear: 07/2012, hysterectomy Mammogram: 01/2019 Colon Screening: never Vision Screening: annually Dentist: biannually  Diet: She does eat meat. She consumes some fruits and veggies daily. She does eat some fried foods. She drinks mostly sweet tea. Exercise: None  Review of Systems      Past Medical History:  Diagnosis Date  . Frequent headaches     Current Outpatient Medications  Medication Sig Dispense Refill  . ALPRAZolam (XANAX) 0.5 MG tablet TAKE 1 TABLET(0.5 MG) BY MOUTH DAILY AS NEEDED FOR ANXIETY 20 tablet 0  . busPIRone (BUSPAR) 5 MG tablet TAKE 1 TABLET(5 MG) BY MOUTH TWICE DAILY 60 tablet 2  . ibuprofen (ADVIL,MOTRIN) 200 MG tablet Take 200 mg by mouth as needed.    Marland Kitchen lisinopril-hydrochlorothiazide (ZESTORETIC) 10-12.5 MG tablet Take 1 tablet by mouth daily. 90 tablet 1  . Probiotic Product (PROBIOTIC DAILY) CAPS Take 1 capsule by mouth daily.    Bernadette Hoit Sodium (SENNA PLUS PO) Take 2 tablets by mouth 2 (two) times daily.    . sertraline (ZOLOFT) 100 MG tablet TAKE 2 TABLETS(200 MG) BY MOUTH DAILY 180 tablet 1   No current facility-administered medications for this visit.    No Known Allergies  Family History  Problem Relation Age of Onset  . Hypertension Mother   . Breast cancer Neg Hx     Social History   Socioeconomic History  . Marital status:  Married    Spouse name: Not on file  . Number of children: Not on file  . Years of education: Not on file  . Highest education level: Not on file  Occupational History  . Not on file  Tobacco Use  . Smoking status: Never Smoker  . Smokeless tobacco: Never Used  Substance and Sexual Activity  . Alcohol use: No    Alcohol/week: 0.0 standard drinks  . Drug use: No  . Sexual activity: Yes  Other Topics Concern  . Not on file  Social History Narrative  . Not on file   Social Determinants of Health   Financial Resource Strain:   . Difficulty of Paying Living Expenses: Not on file  Food Insecurity:   . Worried About Programme researcher, broadcasting/film/video in the Last Year: Not on file  . Ran Out of Food in the Last Year: Not on file  Transportation Needs:   . Lack of Transportation (Medical): Not on file  . Lack of Transportation (Non-Medical): Not on file  Physical Activity:   . Days of Exercise per Week: Not on file  . Minutes of Exercise per Session: Not on file  Stress:   . Feeling of Stress : Not on file  Social Connections:   . Frequency of Communication with Friends and Family: Not on file  . Frequency of Social Gatherings with Friends and Family: Not on file  . Attends  Religious Services: Not on file  . Active Member of Clubs or Organizations: Not on file  . Attends Banker Meetings: Not on file  . Marital Status: Not on file  Intimate Partner Violence:   . Fear of Current or Ex-Partner: Not on file  . Emotionally Abused: Not on file  . Physically Abused: Not on file  . Sexually Abused: Not on file     Constitutional: Denies fever, malaise, fatigue, headache or abrupt weight changes.  HEENT: Denies eye pain, eye redness, ear pain, ringing in the ears, wax buildup, runny nose, nasal congestion, bloody nose, or sore throat. Respiratory: Denies difficulty breathing, shortness of breath, cough or sputum production.   Cardiovascular: Denies chest pain, chest tightness,  palpitations or swelling in the hands or feet.  Gastrointestinal: Denies abdominal pain, bloating, constipation, diarrhea or blood in the stool.  GU: Denies urgency, frequency, pain with urination, burning sensation, blood in urine, odor or discharge. Musculoskeletal: Denies decrease in range of motion, difficulty with gait, muscle pain or joint pain and swelling.  Skin: Denies redness, rashes, lesions or ulcercations.  Neurological: Denies dizziness, difficulty with memory, difficulty with speech or problems with balance and coordination.  Psych: Pt has a history of anxiety and depression. Denies SI/HI.  No other specific complaints in a complete review of systems (except as listed in HPI above).  Objective:   Physical Exam  BP 128/84   Pulse 93   Temp 97.6 F (36.4 C) (Temporal)   Ht 5' 3.5" (1.613 m)   Wt 253 lb (114.8 kg)   SpO2 98%   BMI 44.11 kg/m   Wt Readings from Last 3 Encounters:  10/28/18 238 lb (108 kg)  09/25/18 242 lb (109.8 kg)  05/23/17 243 lb (110.2 kg)    General: Appears her stated age, obese, in NAD. Skin: Warm, dry and intact. No rashes, lesions or ulcerations noted. HEENT: Head: normal shape and size; Eyes: sclera white, no icterus, conjunctiva pink, PERRLA and EOMs intact;  Neck:  Neck supple, trachea midline. No masses, lumps or thyromegaly present.  Cardiovascular: Normal rate and rhythm. S1,S2 noted.  No murmur, rubs or gallops noted. No JVD or BLE edema. No carotid bruits noted. Pulmonary/Chest: Normal effort and positive vesicular breath sounds. No respiratory distress. No wheezes, rales or ronchi noted.  Abdomen: Soft and nontender. Normal bowel sounds. No distention or masses noted. Liver, spleen and kidneys non palpable. Musculoskeletal: Strength 5/5 BUE/BLE. No difficulty with gait.  Neurological: Alert and oriented. Cranial nerves II-XII grossly intact. Coordination normal.  Psychiatric: Mood and affect normal. Behavior is normal. Judgment and  thought content normal.     BMET    Component Value Date/Time   NA 136 10/28/2018 1555   K 3.7 10/28/2018 1555   CL 94 (L) 10/28/2018 1555   CO2 33 (H) 10/28/2018 1555   GLUCOSE 95 10/28/2018 1555   BUN 14 10/28/2018 1555   CREATININE 0.90 10/28/2018 1555   CALCIUM 9.7 10/28/2018 1555    Lipid Panel     Component Value Date/Time   CHOL 231 (H) 10/28/2018 1555   TRIG 335.0 (H) 10/28/2018 1555   HDL 47.50 10/28/2018 1555   CHOLHDL 5 10/28/2018 1555   VLDL 67.0 (H) 10/28/2018 1555   LDLCALC 115 (H) 09/05/2017 1104    CBC    Component Value Date/Time   WBC 11.3 (H) 10/28/2018 1555   RBC 5.14 (H) 10/28/2018 1555   HGB 14.8 10/28/2018 1555   HGB 8.3 (L) 03/26/2011  1155   HCT 44.7 10/28/2018 1555   HCT 31.5 (L) 03/27/2011 0559   PLT 268.0 10/28/2018 1555   MCV 86.9 10/28/2018 1555   MCHC 33.1 10/28/2018 1555   RDW 13.4 10/28/2018 1555    Hgb A1C Lab Results  Component Value Date   HGBA1C 5.9 10/28/2018           Assessment & Plan:   Preventative Health Maintenance:  She declines flu shot She declines tetanus Encouraged her to get a covid shot She no longer needs pap smears Mammogram UTD Cologuard ordered Encouraged her to consume a balanced diet and exercise regimen Advised her to see an eye doctor and dentist annually Will check CBC, CMET, Lipid, A1C, HIV and Hep C today  RTC in 1 year, sooner if needed Nicki Reaper, NP This visit occurred during the SARS-CoV-2 public health emergency.  Safety protocols were in place, including screening questions prior to the visit, additional usage of staff PPE, and extensive cleaning of exam room while observing appropriate contact time as indicated for disinfecting solutions.

## 2019-12-11 NOTE — Patient Instructions (Signed)

## 2019-12-12 ENCOUNTER — Encounter: Payer: Self-pay | Admitting: Internal Medicine

## 2019-12-14 LAB — CBC
HCT: 41.9 % (ref 35.0–45.0)
Hemoglobin: 14.4 g/dL (ref 11.7–15.5)
MCH: 28.5 pg (ref 27.0–33.0)
MCHC: 34.4 g/dL (ref 32.0–36.0)
MCV: 83 fL (ref 80.0–100.0)
MPV: 10.7 fL (ref 7.5–12.5)
Platelets: 243 10*3/uL (ref 140–400)
RBC: 5.05 10*6/uL (ref 3.80–5.10)
RDW: 13.5 % (ref 11.0–15.0)
WBC: 9.4 10*3/uL (ref 3.8–10.8)

## 2019-12-14 LAB — DRUG MONITORING, PANEL 8 WITH CONFIRMATION, URINE
6 Acetylmorphine: NEGATIVE ng/mL (ref <10)
Alcohol Metabolites: NEGATIVE ng/mL
Amphetamines: NEGATIVE ng/mL (ref <500)
Benzodiazepines: NEGATIVE ng/mL (ref <100)
Buprenorphine, Urine: NEGATIVE ng/mL (ref <5)
Cocaine Metabolite: NEGATIVE ng/mL (ref <150)
Creatinine: 69 mg/dL
MDMA: NEGATIVE ng/mL (ref <500)
Marijuana Metabolite: NEGATIVE ng/mL (ref <20)
Opiates: NEGATIVE ng/mL (ref <100)
Oxidant: NEGATIVE ug/mL
Oxycodone: NEGATIVE ng/mL (ref <100)
pH: 5.6 (ref 4.5–9.0)

## 2019-12-14 LAB — HEMOGLOBIN A1C
Hgb A1c MFr Bld: 5.7 % of total Hgb — ABNORMAL HIGH (ref <5.7)
Mean Plasma Glucose: 117 (calc)
eAG (mmol/L): 6.5 (calc)

## 2019-12-14 LAB — COMPREHENSIVE METABOLIC PANEL
AG Ratio: 1.2 (calc) (ref 1.0–2.5)
ALT: 20 U/L (ref 6–29)
AST: 20 U/L (ref 10–35)
Albumin: 4 g/dL (ref 3.6–5.1)
Alkaline phosphatase (APISO): 112 U/L (ref 37–153)
BUN: 13 mg/dL (ref 7–25)
CO2: 28 mmol/L (ref 20–32)
Calcium: 9.3 mg/dL (ref 8.6–10.4)
Chloride: 99 mmol/L (ref 98–110)
Creat: 0.81 mg/dL (ref 0.50–1.05)
Globulin: 3.4 g/dL (calc) (ref 1.9–3.7)
Glucose, Bld: 92 mg/dL (ref 65–99)
Potassium: 3.8 mmol/L (ref 3.5–5.3)
Sodium: 138 mmol/L (ref 135–146)
Total Bilirubin: 0.6 mg/dL (ref 0.2–1.2)
Total Protein: 7.4 g/dL (ref 6.1–8.1)

## 2019-12-14 LAB — HEPATITIS C ANTIBODY
Hepatitis C Ab: NONREACTIVE
SIGNAL TO CUT-OFF: 0.02 (ref <1.00)

## 2019-12-14 LAB — LIPID PANEL
Cholesterol: 218 mg/dL — ABNORMAL HIGH (ref <200)
HDL: 55 mg/dL (ref >=50)
LDL Cholesterol (Calc): 132 mg/dL (calc) — ABNORMAL HIGH
Non-HDL Cholesterol (Calc): 163 mg/dL (calc) — ABNORMAL HIGH (ref <130)
Total CHOL/HDL Ratio: 4 (calc) (ref <5.0)
Triglycerides: 178 mg/dL — ABNORMAL HIGH (ref <150)

## 2019-12-14 LAB — HIV ANTIBODY (ROUTINE TESTING W REFLEX): HIV 1&2 Ab, 4th Generation: NONREACTIVE

## 2019-12-14 LAB — DM TEMPLATE

## 2019-12-15 MED ORDER — PEN NEEDLES 30G X 5 MM MISC: 1.0000 "application " | Freq: Every day | 0 refills | Status: DC

## 2019-12-15 MED ORDER — SAXENDA 18 MG/3ML ~~LOC~~ SOPN: PEN_INJECTOR | SUBCUTANEOUS | 0 refills | Status: AC

## 2019-12-15 NOTE — Addendum Note (Signed)
Addended by: Lorre Munroe on: 12/15/2019 12:19 PM   Modules accepted: Orders

## 2019-12-23 ENCOUNTER — Telehealth: Payer: Self-pay

## 2019-12-23 NOTE — Telephone Encounter (Signed)
PA was submitted via [email or web address] and I received a fax that the medication was denied as it is just not on formualry  Pt is aware as instructed and per [facility] there is no alternative

## 2020-01-28 ENCOUNTER — Other Ambulatory Visit: Payer: Self-pay | Admitting: Internal Medicine

## 2020-02-08 ENCOUNTER — Other Ambulatory Visit: Payer: Self-pay | Admitting: Internal Medicine

## 2020-02-08 DIAGNOSIS — I10 Essential (primary) hypertension: Secondary | ICD-10-CM

## 2020-02-20 ENCOUNTER — Other Ambulatory Visit: Payer: Self-pay | Admitting: Internal Medicine

## 2020-04-05 IMAGING — MG DIGITAL SCREENING BILAT W/ TOMO W/ CAD
8 series · 8 of 24 positions shown · non-contrast
Comparison: Previous exam(s).

CLINICAL DATA: Screening.

EXAM:
DIGITAL SCREENING BILATERAL MAMMOGRAM WITH TOMO AND CAD

[L CC synth-2D]
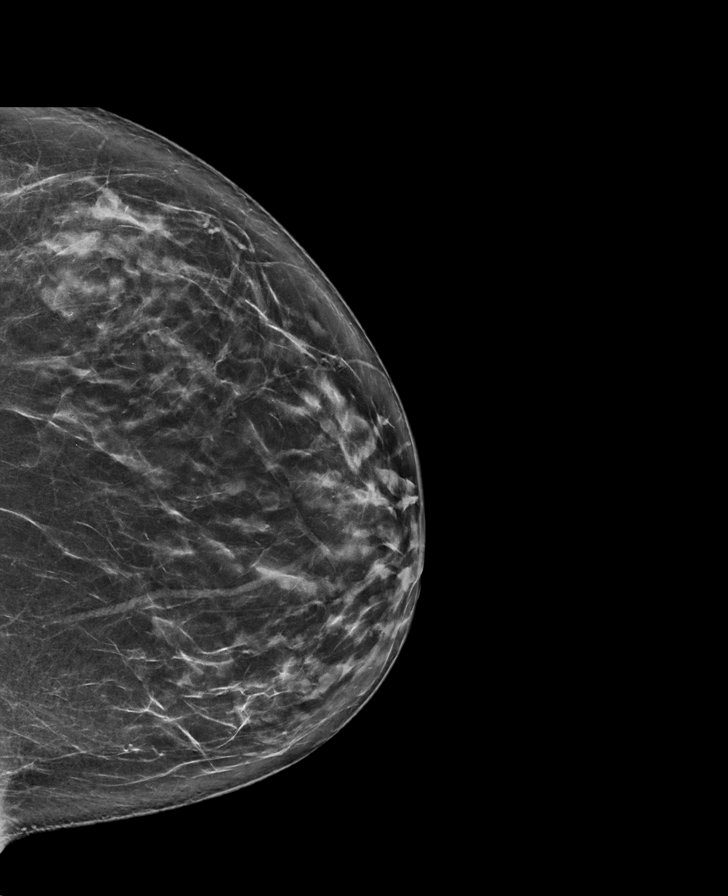

[R CC synth-2D]
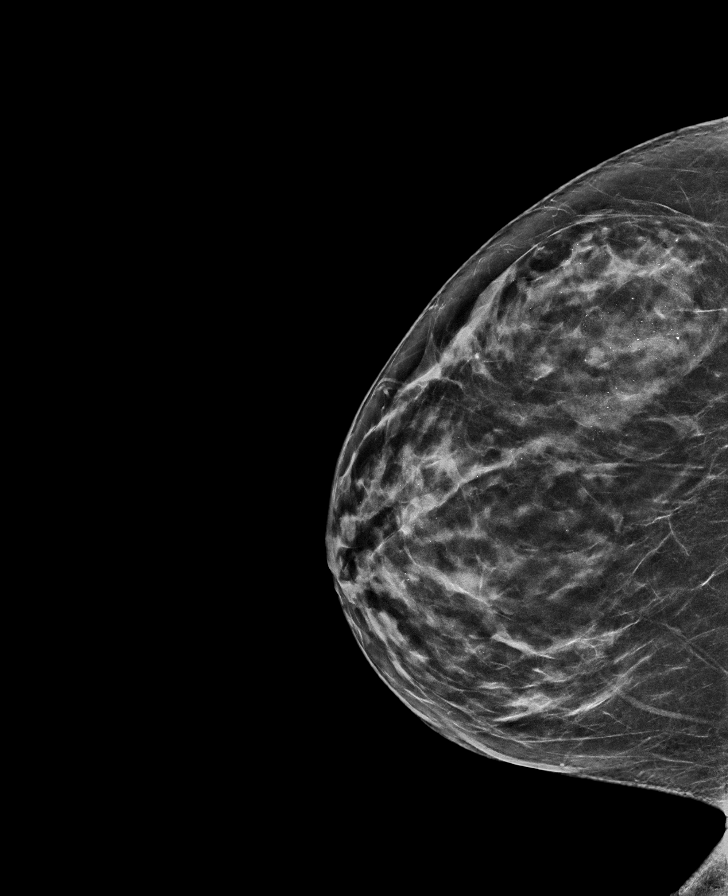

[R MLO synth-2D]
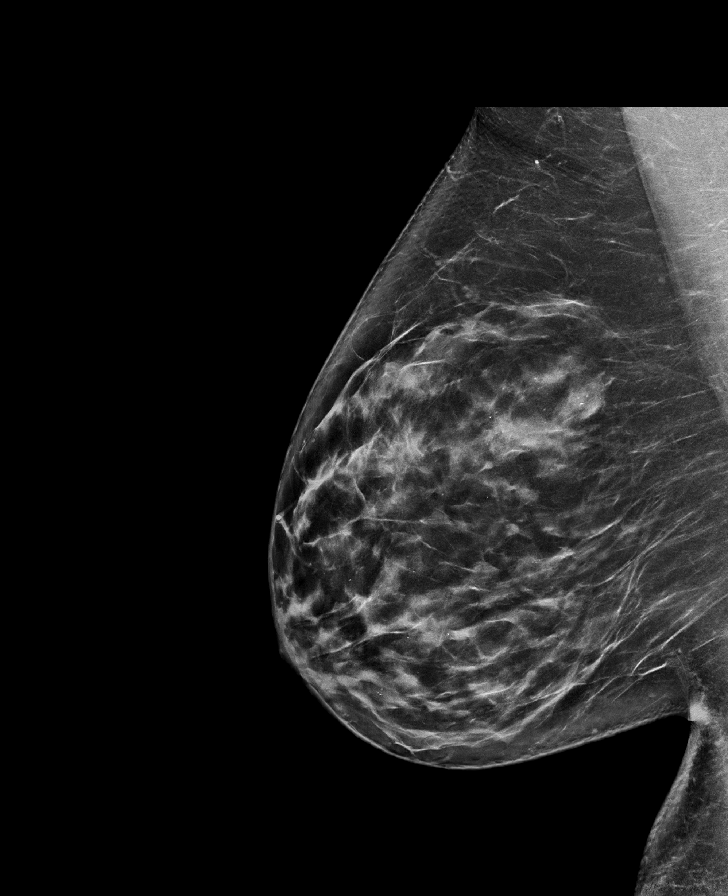

[L MLO synth-2D]
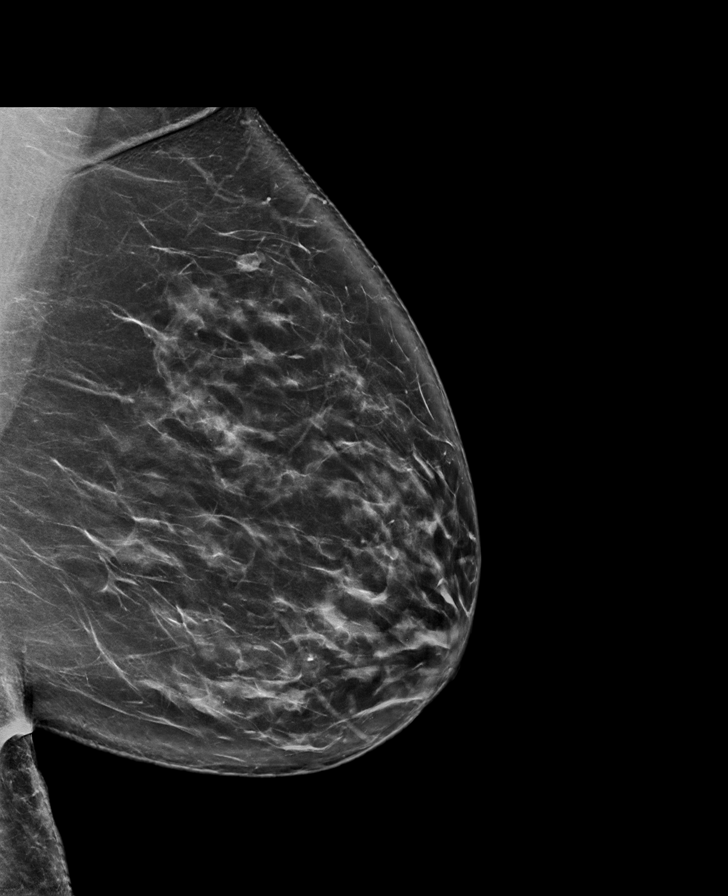

[L MLO tomo · tomo slice 47/94.0]
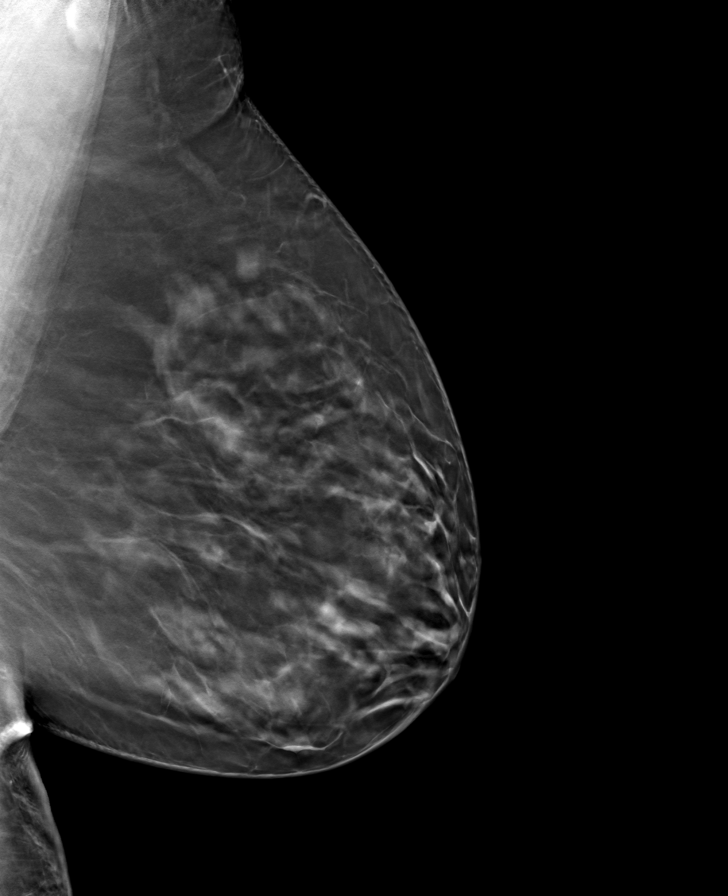

[R CC tomo · tomo slice 35/68.0]
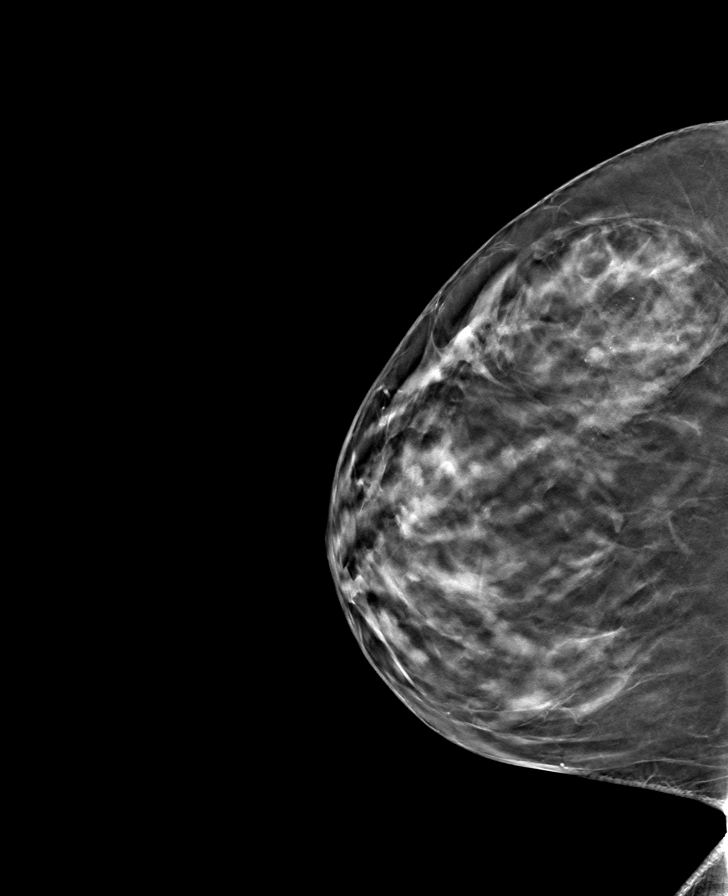

[R MLO tomo · tomo slice 45/90.0]
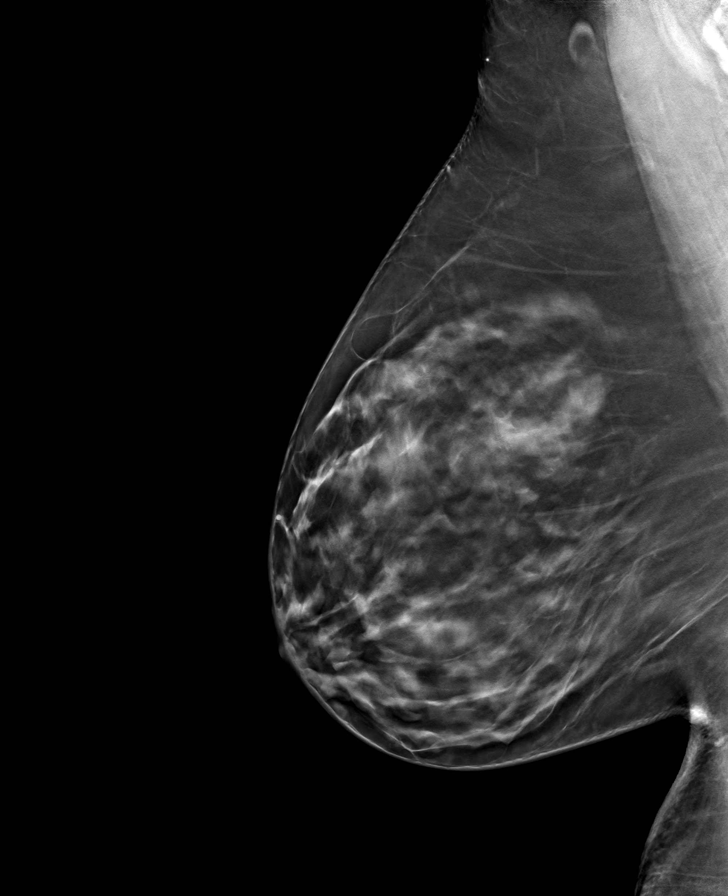

[L CC tomo · tomo slice 39/78.0]
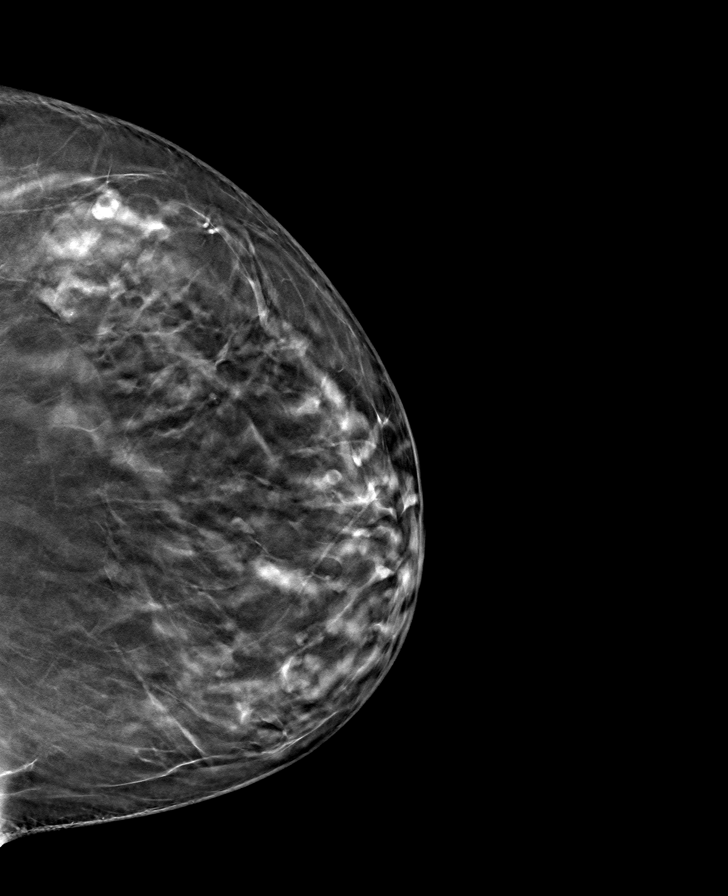

[8 of 24 positions shown; findings below may reference images not displayed]

ACR Breast Density Category c: The breast tissue is heterogeneously
dense, which may obscure small masses.
FINDINGS: There are no findings suspicious for malignancy. Images were
processed with CAD.
IMPRESSION: No mammographic evidence of malignancy. A result letter of this
screening mammogram will be mailed directly to the patient.

RECOMMENDATION:
Screening mammogram in one year. (Code:[id number]-[id number])

BI-RADS CATEGORY  1: Negative.

## 2020-04-26 ENCOUNTER — Other Ambulatory Visit: Payer: Self-pay | Admitting: Internal Medicine

## 2020-04-26 NOTE — Telephone Encounter (Signed)
Refill request Buspirone Last refill 06/27/19 #60/2 lst office visit 12/11/19

## 2020-04-27 NOTE — Telephone Encounter (Signed)
Not sure where the previous date came from. Rx states Last refill: 03/30/2020

## 2020-04-27 NOTE — Telephone Encounter (Signed)
How exactly is she taking this? Obviously not as directed.

## 2020-05-12 ENCOUNTER — Other Ambulatory Visit: Payer: Self-pay | Admitting: Internal Medicine

## 2020-05-17 ENCOUNTER — Other Ambulatory Visit: Payer: Self-pay | Admitting: Internal Medicine

## 2020-05-17 MED ORDER — ALPRAZOLAM 0.5 MG PO TABS: 0.5000 mg | ORAL_TABLET | Freq: Every day | ORAL | 0 refills | Status: DC | PRN

## 2020-05-24 ENCOUNTER — Other Ambulatory Visit: Payer: Self-pay | Admitting: Internal Medicine

## 2020-08-05 ENCOUNTER — Other Ambulatory Visit: Payer: Self-pay

## 2020-08-05 ENCOUNTER — Ambulatory Visit: Payer: Self-pay | Admitting: Internal Medicine

## 2020-08-05 ENCOUNTER — Encounter: Payer: Self-pay | Admitting: Internal Medicine

## 2020-08-05 VITALS — BP 113/77 | HR 85 | Temp 97.5°F | Resp 17 | Ht 63.5 in | Wt 253.2 lb

## 2020-08-05 DIAGNOSIS — F419 Anxiety disorder, unspecified: Secondary | ICD-10-CM

## 2020-08-05 DIAGNOSIS — E7800 Pure hypercholesterolemia, unspecified: Secondary | ICD-10-CM

## 2020-08-05 DIAGNOSIS — R0683 Snoring: Secondary | ICD-10-CM | POA: Diagnosis not present

## 2020-08-05 DIAGNOSIS — R7303 Prediabetes: Secondary | ICD-10-CM

## 2020-08-05 DIAGNOSIS — F32A Depression, unspecified: Secondary | ICD-10-CM

## 2020-08-05 DIAGNOSIS — R5383 Other fatigue: Secondary | ICD-10-CM

## 2020-08-05 DIAGNOSIS — I10 Essential (primary) hypertension: Secondary | ICD-10-CM

## 2020-08-05 NOTE — Progress Notes (Signed)
Subjective:    Patient ID: Alexis Berger, female    DOB: 12-28-1970, 50 y.o.   MRN: [id number]  HPI  Pt presents to the clinic today for follow up of chronic conditions.  HTN: Her BP today is 113/77. She is taking Lisinopril HCT as prescribed. There is no ECG on file.  Anxiety and Depression: Chronic, managed on Sertraline, Buspar and Xanax. She is not currently seeing a therapist. She denies SI/HI.  HLD: Her last LDL was 132, triglycerides 178, 12/2019. She is not taking any cholesterol lowering medication. She does not consume a low fat diet.  Prediabetes: Her last A1C was 5.7%, 12/2019. She is not taking any oral diabetic medication at this time. She does not check her sugars.  Review of Systems     Past Medical History:  Diagnosis Date   Frequent headaches     Current Outpatient Medications  Medication Sig Dispense Refill   ALPRAZolam (XANAX) 0.5 MG tablet Take 1 tablet (0.5 mg total) by mouth daily as needed for anxiety. 20 tablet 0   busPIRone (BUSPAR) 5 MG tablet TAKE 1 TABLET(5 MG) BY MOUTH TWICE DAILY 60 tablet 2   ibuprofen (ADVIL,MOTRIN) 200 MG tablet Take 200 mg by mouth as needed.     Insulin Pen Needle (PEN NEEDLES) 30G X 5 MM MISC 1 application by Does not apply route daily. 100 each 0   lisinopril-hydrochlorothiazide (ZESTORETIC) 10-12.5 MG tablet TAKE 1 TABLET BY MOUTH DAILY 90 tablet 2   Sennosides-Docusate Sodium (SENNA PLUS PO) Take 2 tablets by mouth 2 (two) times daily.     sertraline (ZOLOFT) 100 MG tablet TAKE 2 TABLETS(200 MG) BY MOUTH DAILY 180 tablet 1   No current facility-administered medications for this visit.    No Known Allergies  Family History  Problem Relation Age of Onset   Hypertension Mother    Breast cancer Neg Hx     Social History   Socioeconomic History   Marital status: Married    Spouse name: Not on file   Number of children: Not on file   Years of education: Not on file   Highest education level: Not on file   Occupational History   Not on file  Tobacco Use   Smoking status: Never   Smokeless tobacco: Never  Substance and Sexual Activity   Alcohol use: No    Alcohol/week: 0.0 standard drinks   Drug use: No   Sexual activity: Yes  Other Topics Concern   Not on file  Social History Narrative   Not on file   Social Determinants of Health   Financial Resource Strain: Not on file  Food Insecurity: Not on file  Transportation Needs: Not on file  Physical Activity: Not on file  Stress: Not on file  Social Connections: Not on file  Intimate Partner Violence: Not on file     Constitutional: Patient reports weight gain, fatigue.  Denies fever, malaise, headache.Marland Kitchen  HEENT: Denies eye pain, eye redness, ear pain, ringing in the ears, wax buildup, runny nose, nasal congestion, bloody nose, or sore throat. Respiratory: Denies difficulty breathing, shortness of breath, cough or sputum production.   Cardiovascular: Denies chest pain, chest tightness, palpitations or swelling in the hands or feet.  Gastrointestinal: Denies abdominal pain, bloating, constipation, diarrhea or blood in the stool.  GU: Denies urgency, frequency, pain with urination, burning sensation, blood in urine, odor or discharge. Musculoskeletal: Denies decrease in range of motion, difficulty with gait, muscle pain or joint pain and  swelling.  Skin: Denies redness, rashes, lesions or ulcercations.  Neurological: Denies dizziness, difficulty with memory, difficulty with speech or problems with balance and coordination.  Psych: Pt has a history of anxiety and depression. Denies SI/HI.  No other specific complaints in a complete review of systems (except as listed in HPI above).  Objective:   Physical Exam BP 113/77 (BP Location: Right Arm, Patient Position: Sitting, Cuff Size: Large)   Pulse 85   Temp (!) 97.5 F (36.4 C) (Temporal)   Resp 17   Ht 5' 3.5" (1.613 m)   Wt 114.9 kg   SpO2 99%   BMI 44.15 kg/m   Wt  Readings from Last 3 Encounters:  12/11/19 253 lb (114.8 kg)  10/28/18 238 lb (108 kg)  09/25/18 242 lb (109.8 kg)    General: Appears her stated age, obese, in NAD. Skin: Warm, dry and intact. No ulcerations noted. HEENT: Head: normal shape and size; Eyes: sclera white and EOMs intact;  Cardiovascular: Normal rate and rhythm. S1,S2 noted.  No murmur, rubs or gallops noted. No JVD or BLE edema. No carotid bruits noted. Pulmonary/Chest: Normal effort and positive vesicular breath sounds. No respiratory distress. No wheezes, rales or ronchi noted.  Musculoskeletal: No difficulty with gait.  Neurological: Alert and oriented. Marland Kitchen  Psychiatric: Mood and affect normal.  Tearful.  Judgment and thought content normal.    BMET    Component Value Date/Time   NA 138 12/11/2019 1413   K 3.8 12/11/2019 1413   CL 99 12/11/2019 1413   CO2 28 12/11/2019 1413   GLUCOSE 92 12/11/2019 1413   BUN 13 12/11/2019 1413   CREATININE 0.81 12/11/2019 1413   CALCIUM 9.3 12/11/2019 1413    Lipid Panel     Component Value Date/Time   CHOL 218 (H) 12/11/2019 1413   TRIG 178 (H) 12/11/2019 1413   HDL 55 12/11/2019 1413   CHOLHDL 4.0 12/11/2019 1413   VLDL 67.0 (H) 10/28/2018 1555   LDLCALC 132 (H) 12/11/2019 1413    CBC    Component Value Date/Time   WBC 9.4 12/11/2019 1413   RBC 5.05 12/11/2019 1413   HGB 14.4 12/11/2019 1413   HGB 8.3 (L) 03/26/2011 1155   HCT 41.9 12/11/2019 1413   HCT 31.5 (L) 03/27/2011 0559   PLT 243 12/11/2019 1413   MCV 83.0 12/11/2019 1413   MCH 28.5 12/11/2019 1413   MCHC 34.4 12/11/2019 1413   RDW 13.5 12/11/2019 1413    Hgb A1C Lab Results  Component Value Date   HGBA1C 5.7 (H) 12/11/2019            Assessment & Plan:   Fatigue, Snoring:  We will check TSH, vitamin D and B12 today Could be related to her anxiety and depression Could consider sleep study if labs are normal   RTC in 6 months for your annual exam  Nicki Reaper, NP This visit  occurred during the SARS-CoV-2 public health emergency.  Safety protocols were in place, including screening questions prior to the visit, additional usage of staff PPE, and extensive cleaning of exam room while observing appropriate contact time as indicated for disinfecting solutions.

## 2020-08-06 DIAGNOSIS — R7303 Prediabetes: Secondary | ICD-10-CM | POA: Insufficient documentation

## 2020-08-06 DIAGNOSIS — E7800 Pure hypercholesterolemia, unspecified: Secondary | ICD-10-CM | POA: Insufficient documentation

## 2020-08-06 LAB — COMPLETE METABOLIC PANEL WITH GFR
AG Ratio: 1.4 (calc) (ref 1.0–2.5)
ALT: 22 U/L (ref 6–29)
AST: 20 U/L (ref 10–35)
Albumin: 4.3 g/dL (ref 3.6–5.1)
Alkaline phosphatase (APISO): 103 U/L (ref 37–153)
BUN: 12 mg/dL (ref 7–25)
CO2: 30 mmol/L (ref 20–32)
Calcium: 9.5 mg/dL (ref 8.6–10.4)
Chloride: 98 mmol/L (ref 98–110)
Creat: 0.79 mg/dL (ref 0.50–1.03)
Globulin: 3.1 g/dL (calc) (ref 1.9–3.7)
Glucose, Bld: 122 mg/dL (ref 65–139)
Potassium: 4 mmol/L (ref 3.5–5.3)
Sodium: 138 mmol/L (ref 135–146)
Total Bilirubin: 0.5 mg/dL (ref 0.2–1.2)
Total Protein: 7.4 g/dL (ref 6.1–8.1)
eGFR: 90 mL/min/{1.73_m2} (ref >=60)

## 2020-08-06 LAB — VITAMIN D 25 HYDROXY (VIT D DEFICIENCY, FRACTURES): Vit D, 25-Hydroxy: 30 ng/mL (ref 30–100)

## 2020-08-06 LAB — LIPID PANEL
Cholesterol: 209 mg/dL — ABNORMAL HIGH (ref <200)
HDL: 51 mg/dL (ref >=50)
LDL Cholesterol (Calc): 127 mg/dL (calc) — ABNORMAL HIGH
Non-HDL Cholesterol (Calc): 158 mg/dL (calc) — ABNORMAL HIGH (ref <130)
Total CHOL/HDL Ratio: 4.1 (calc) (ref <5.0)
Triglycerides: 185 mg/dL — ABNORMAL HIGH (ref <150)

## 2020-08-06 LAB — HEMOGLOBIN A1C
Hgb A1c MFr Bld: 5.7 % of total Hgb — ABNORMAL HIGH (ref <5.7)
Mean Plasma Glucose: 117 mg/dL
eAG (mmol/L): 6.5 mmol/L

## 2020-08-06 LAB — TSH: TSH: 1.74 mIU/L

## 2020-08-06 LAB — VITAMIN B12: Vitamin B-12: 874 pg/mL (ref 200–1100)

## 2020-08-06 NOTE — Assessment & Plan Note (Signed)
Encourage low-carb, high-protein diet and exercise weight loss Could consider Ozempic pending labs

## 2020-08-06 NOTE — Assessment & Plan Note (Signed)
Continue Lisinopril HCT Reinforced DASH diet and exercise weight loss C-Met today

## 2020-08-06 NOTE — Assessment & Plan Note (Signed)
Persistent Continue Sertraline, BuSpar and Xanax Support offered

## 2020-08-06 NOTE — Assessment & Plan Note (Signed)
A1c today Encourage low-carb diet and exercise weight loss Will consider Ozempic pending labs

## 2020-08-06 NOTE — Assessment & Plan Note (Signed)
C-Met and lipid profile today Encouraged her to consume a low-fat diet 

## 2020-08-06 NOTE — Patient Instructions (Signed)
https://www.diabeteseducator.org/docs/default-source/living-with-diabetes/conquering-the-grocery-store-v1.pdf?sfvrsn=4">  Carbohydrate Counting for Diabetes Mellitus, Adult Carbohydrate counting is a method of keeping track of how many carbohydrates you eat. Eating carbohydrates naturally increases the amount of sugar (glucose) in the blood. Counting how many carbohydrates you eat improves your bloodglucose control, which helps you manage your diabetes. It is important to know how many carbohydrates you can safely have in each meal. This is different for every person. A dietitian can help you make a meal plan and calculate how many carbohydrates you should have at each meal andsnack. What foods contain carbohydrates? Carbohydrates are found in the following foods: Grains, such as breads and cereals. Dried beans and soy products. Starchy vegetables, such as potatoes, peas, and corn. Fruit and fruit juices. Milk and yogurt. Sweets and snack foods, such as cake, cookies, candy, chips, and soft drinks. How do I count carbohydrates in foods? There are two ways to count carbohydrates in food. You can read food labels or learn standard serving sizes of foods. You can use either of the methods or acombination of both. Using the Nutrition Facts label The Nutrition Facts list is included on the labels of almost all packaged foods and beverages in the [place] It includes: The serving size. Information about nutrients in each serving, including the grams (g) of carbohydrate per serving. To use the Nutrition Facts: Decide how many servings you will have. Multiply the number of servings by the number of carbohydrates per serving. The resulting number is the total amount of carbohydrates that you will be having. Learning the standard serving sizes of foods When you eat carbohydrate foods that are not packaged or do not include Nutrition Facts on the label, you need to measure the servings in order to count the  amount of carbohydrates. Measure the foods that you will eat with a food scale or measuring cup, if needed. Decide how many standard-size servings you will eat. Multiply the number of servings by 15. For foods that contain carbohydrates, one serving equals 15 g of carbohydrates. For example, if you eat 2 cups or 10 oz (300 g) of strawberries, you will have eaten 2 servings and 30 g of carbohydrates (2 servings x 15 g = 30 g). For foods that have more than one food mixed, such as soups and casseroles, you must count the carbohydrates in each food that is included. The following list contains standard serving sizes of common carbohydrate-rich foods. Each of these servings has about 15 g of carbohydrates: 1 slice of bread. 1 six-inch (15 cm) tortilla. ? cup or 2 oz (53 g) cooked rice or pasta.  cup or 3 oz (85 g) cooked or canned, drained and rinsed beans or lentils.  cup or 3 oz (85 g) starchy vegetable, such as peas, corn, or squash.  cup or 4 oz (120 g) hot cereal.  cup or 3 oz (85 g) boiled or mashed potatoes, or  or 3 oz (85 g) of a large baked potato.  cup or 4 fl oz (118 mL) fruit juice. 1 cup or 8 fl oz (237 mL) milk. 1 small or 4 oz (106 g) apple.  or 2 oz (63 g) of a medium banana. 1 cup or 5 oz (150 g) strawberries. 3 cups or 1 oz (24 g) popped popcorn. What is an example of carbohydrate counting? To calculate the number of carbohydrates in this sample meal, follow the stepsshown below. Sample meal 3 oz (85 g) chicken breast. ? cup or 4 oz (106 g) brown rice.    cup or 3 oz (85 g) corn. 1 cup or 8 fl oz (237 mL) milk. 1 cup or 5 oz (150 g) strawberries with sugar-free whipped topping. Carbohydrate calculation Identify the foods that contain carbohydrates: Rice. Corn. Milk. Strawberries. Calculate how many servings you have of each food: 2 servings rice. 1 serving corn. 1 serving milk. 1 serving strawberries. Multiply each number of servings by 15 g: 2 servings  rice x 15 g = 30 g. 1 serving corn x 15 g = 15 g. 1 serving milk x 15 g = 15 g. 1 serving strawberries x 15 g = 15 g. Add together all of the amounts to find the total grams of carbohydrates eaten: 30 g + 15 g + 15 g + 15 g = 75 g of carbohydrates total. What are tips for following this plan? Shopping Develop a meal plan and then make a shopping list. Buy fresh and frozen vegetables, fresh and frozen fruit, dairy, eggs, beans, lentils, and whole grains. Look at food labels. Choose foods that have more fiber and less sugar. Avoid processed foods and foods with added sugars. Meal planning Aim to have the same amount of carbohydrates at each meal and for each snack time. Plan to have regular, balanced meals and snacks. Where to find more information American Diabetes Association: www.diabetes.org Centers for Disease Control and Prevention: www.cdc.gov Summary Carbohydrate counting is a method of keeping track of how many carbohydrates you eat. Eating carbohydrates naturally increases the amount of sugar (glucose) in the blood. Counting how many carbohydrates you eat improves your blood glucose control, which helps you manage your diabetes. A dietitian can help you make a meal plan and calculate how many carbohydrates you should have at each meal and snack. This information is not intended to replace advice given to you by your health care provider. Make sure you discuss any questions you have with your healthcare provider. Document Revised: 12/25/2018 Document Reviewed: 12/26/2018 Elsevier Patient Education  2021 Elsevier Inc.  

## 2020-08-14 ENCOUNTER — Encounter: Payer: Self-pay | Admitting: Internal Medicine

## 2020-08-22 MED ORDER — SAXENDA 18 MG/3ML ~~LOC~~ SOPN: PEN_INJECTOR | SUBCUTANEOUS | 0 refills | Status: DC

## 2020-08-22 MED ORDER — PEN NEEDLES 30G X 5 MM MISC: 1.0000 "application " | Freq: Every day | 0 refills | Status: DC

## 2020-08-28 ENCOUNTER — Other Ambulatory Visit: Payer: Self-pay | Admitting: Family

## 2020-08-29 MED ORDER — ALPRAZOLAM 0.5 MG PO TABS: 0.5000 mg | ORAL_TABLET | Freq: Every day | ORAL | 0 refills | Status: DC | PRN

## 2020-08-29 NOTE — Addendum Note (Signed)
Addended by: Lorre Munroe on: 08/29/2020 11:07 AM   Modules accepted: Orders

## 2020-08-30 NOTE — Telephone Encounter (Signed)
[facility] called [facility]  for resubmission [id number]  Cb [phone] Just wanted to make sure you did not have any questions

## 2020-11-14 ENCOUNTER — Other Ambulatory Visit: Payer: Self-pay | Admitting: Internal Medicine

## 2020-11-14 DIAGNOSIS — I10 Essential (primary) hypertension: Secondary | ICD-10-CM

## 2020-11-14 NOTE — Telephone Encounter (Signed)
Requested Prescriptions  Pending Prescriptions Disp Refills  . lisinopril-hydrochlorothiazide (ZESTORETIC) 10-12.5 MG tablet [Pharmacy Med Name: LISINOPRIL-HCTZ 10/12.5MG  TABLETS] 90 tablet 0    Sig: TAKE 1 TABLET BY MOUTH DAILY     Cardiovascular:  ACEI + Diuretic Combos Passed - 11/14/2020 11:29 AM      Passed - Na in normal range and within 180 days    Sodium  Date Value Ref Range Status  08/05/2020 138 135 - 146 mmol/L Final         Passed - K in normal range and within 180 days    Potassium  Date Value Ref Range Status  08/05/2020 4.0 3.5 - 5.3 mmol/L Final         Passed - Cr in normal range and within 180 days    Creat  Date Value Ref Range Status  08/05/2020 0.79 0.50 - 1.03 mg/dL Final         Passed - Ca in normal range and within 180 days    Calcium  Date Value Ref Range Status  08/05/2020 9.5 8.6 - 10.4 mg/dL Final         Passed - Patient is not pregnant      Passed - Last BP in normal range    BP Readings from Last 1 Encounters:  08/05/20 113/77         Passed - Valid encounter within last 6 months    Recent Outpatient Visits          3 months ago Other fatigue   [facility] [place], Salvadore Oxford, NP

## 2020-11-16 ENCOUNTER — Other Ambulatory Visit: Payer: Self-pay | Admitting: Internal Medicine

## 2020-11-16 NOTE — Telephone Encounter (Signed)
Requested medications are due for refill today.  yes  Requested medications are on the active medications list.  yes  Last refill. 08/29/2020  Future visit scheduled.   no  Notes to clinic.  Medication not delegated.

## 2021-01-18 ENCOUNTER — Encounter: Payer: Self-pay | Admitting: Internal Medicine

## 2021-01-18 ENCOUNTER — Other Ambulatory Visit: Payer: Self-pay

## 2021-01-18 ENCOUNTER — Ambulatory Visit: Payer: 59 | Admitting: Internal Medicine

## 2021-01-18 DIAGNOSIS — I10 Essential (primary) hypertension: Secondary | ICD-10-CM

## 2021-01-18 MED ORDER — LISINOPRIL-HYDROCHLOROTHIAZIDE 20-25 MG PO TABS: 1.0000 | ORAL_TABLET | Freq: Every day | ORAL | 0 refills | Status: DC

## 2021-01-18 NOTE — Progress Notes (Signed)
Subjective:    Patient ID: Alexis Berger, female    DOB: 07-20-1970, 51 y.o.   MRN: [id number]  HPI  Patient presents to clinic today with complaint of right foot swelling.  This has been going on for months but seems to be persistent.  She has some intermittent tingling but denies redness, warmth, numbness or pain.  She denies cough, shortness of breath or chest pain.  She has no history of heart failure.  She is taking Lisinopril HCT as prescribed.  Her BP today is 156/89.  She does not consume a low-salt diet.  Review of Systems     Past Medical History:  Diagnosis Date   Frequent headaches     Current Outpatient Medications  Medication Sig Dispense Refill   ALPRAZolam (XANAX) 0.5 MG tablet TAKE 1 TABLET(0.5 MG) BY MOUTH DAILY AS NEEDED FOR ANXIETY 20 tablet 0   busPIRone (BUSPAR) 5 MG tablet TAKE 1 TABLET(5 MG) BY MOUTH TWICE DAILY 60 tablet 2   ibuprofen (ADVIL,MOTRIN) 200 MG tablet Take 200 mg by mouth as needed.     Insulin Pen Needle (PEN NEEDLES) 30G X 5 MM MISC 1 application by Does not apply route daily. 30 each 0   lisinopril-hydrochlorothiazide (ZESTORETIC) 10-12.5 MG tablet TAKE 1 TABLET BY MOUTH DAILY 90 tablet 0   SAXENDA 18 MG/3ML SOPN Injection 0.6 mg into skin once daily for 1 week, as tolerated increase by increment of 0.6mg  every 1 week, max dose is 3mg  injection daily after 5 weeks. 15 mL 0   Sennosides-Docusate Sodium (SENNA PLUS PO) Take 2 tablets by mouth 2 (two) times daily.     sertraline (ZOLOFT) 100 MG tablet TAKE 2 TABLETS(200 MG) BY MOUTH DAILY 180 tablet 1   No current facility-administered medications for this visit.    No Known Allergies  Family History  Problem Relation Age of Onset   Hypertension Mother    Breast cancer Neg Hx     Social History   Socioeconomic History   Marital status: Married    Spouse name: Not on file   Number of children: Not on file   Years of education: Not on file   Highest education level: Not on file   Occupational History   Not on file  Tobacco Use   Smoking status: Never   Smokeless tobacco: Never  Vaping Use   Vaping Use: Never used  Substance and Sexual Activity   Alcohol use: No    Alcohol/week: 0.0 standard drinks   Drug use: No   Sexual activity: Yes  Other Topics Concern   Not on file  Social History Narrative   Not on file   Social Determinants of Health   Financial Resource Strain: Not on file  Food Insecurity: Not on file  Transportation Needs: Not on file  Physical Activity: Not on file  Stress: Not on file  Social Connections: Not on file  Intimate Partner Violence: Not on file     Constitutional: Denies fever, malaise, fatigue, headache or abrupt weight changes.  Respiratory: Denies difficulty breathing, shortness of breath, cough or sputum production.   Cardiovascular: Patient reports swelling of right lower extremity.  Denies chest pain, chest tightness, palpitations or swelling in the hands.  Musculoskeletal: Denies decrease in range of motion, difficulty with gait, muscle pain or joint pain.  Skin: Denies redness, rashes, lesions or ulcercations.  Neurological: Patient reports intermittent tingling of right foot.  Denies dizziness, difficulty with memory, difficulty with speech or problems with  balance and coordination.   No other specific complaints in a complete review of systems (except as listed in HPI above).  Objective:   Physical Exam  BP (!) 156/89 (BP Location: Right Arm, Patient Position: Sitting, Cuff Size: Large)    Pulse 70    Ht 5' 3.5" (1.613 m)    Wt 267 lb (121.1 kg)    SpO2 100%    BMI 46.56 kg/m   Wt Readings from Last 3 Encounters:  08/05/20 253 lb 3.2 oz (114.9 kg)  12/11/19 253 lb (114.8 kg)  10/28/18 238 lb (108 kg)    General: Appears her stated age, obese, in NAD. Skin: Warm, dry and intact. No redness or warmth noted. Cardiovascular: Normal rate and rhythm. S1,S2 noted.  No murmur, rubs or gallops noted.  Trace  pitting BLE edema, R >L.  Pulmonary/Chest: Normal effort and positive vesicular breath sounds. No respiratory distress. No wheezes, rales or ronchi noted.  Musculoskeletal:  No difficulty with gait.  Neurological: Alert and oriented.   BMET    Component Value Date/Time   NA 138 08/05/2020 1535   K 4.0 08/05/2020 1535   CL 98 08/05/2020 1535   CO2 30 08/05/2020 1535   GLUCOSE 122 08/05/2020 1535   BUN 12 08/05/2020 1535   CREATININE 0.79 08/05/2020 1535   CALCIUM 9.5 08/05/2020 1535    Lipid Panel     Component Value Date/Time   CHOL 209 (H) 08/05/2020 1535   TRIG 185 (H) 08/05/2020 1535   HDL 51 08/05/2020 1535   CHOLHDL 4.1 08/05/2020 1535   VLDL 67.0 (H) 10/28/2018 1555   LDLCALC 127 (H) 08/05/2020 1535    CBC    Component Value Date/Time   WBC 9.4 12/11/2019 1413   RBC 5.05 12/11/2019 1413   HGB 14.4 12/11/2019 1413   HGB 8.3 (L) 03/26/2011 1155   HCT 41.9 12/11/2019 1413   HCT 31.5 (L) 03/27/2011 0559   PLT 243 12/11/2019 1413   MCV 83.0 12/11/2019 1413   MCH 28.5 12/11/2019 1413   MCHC 34.4 12/11/2019 1413   RDW 13.5 12/11/2019 1413    Hgb A1C Lab Results  Component Value Date   HGBA1C 5.7 (H) 08/05/2020           Assessment & Plan:    Webb Silversmith, NP This visit occurred during the SARS-CoV-2 public health emergency.  Safety protocols were in place, including screening questions prior to the visit, additional usage of staff PPE, and extensive cleaning of exam room while observing appropriate contact time as indicated for disinfecting solutions.

## 2021-01-18 NOTE — Patient Instructions (Signed)

## 2021-01-18 NOTE — Assessment & Plan Note (Signed)
Uncontrolled with worsening peripheral edema No signs of heart failure Increase Lisinopril HCT to 20-25 mg daily Reinforced DASH diet and exercise for weight loss CMET today Encourage elevation

## 2021-01-18 NOTE — Assessment & Plan Note (Signed)
Encouraged diet and exercise for weight loss ?

## 2021-01-19 ENCOUNTER — Other Ambulatory Visit: Payer: Self-pay | Admitting: Internal Medicine

## 2021-01-19 NOTE — Telephone Encounter (Signed)
Requested medication (s) are due for refill today: Yes  Requested medication (s) are on the active medication list: Yes  Last refill:  11/17/20  Future visit scheduled: No  Notes to clinic:  See request.    Requested Prescriptions  Pending Prescriptions Disp Refills   ALPRAZolam (XANAX) 0.5 MG tablet [Pharmacy Med Name: ALPRAZOLAM 0.5MG  TABLETS] 20 tablet     Sig: TAKE 1 TABLET(0.5 MG) BY MOUTH DAILY AS NEEDED FOR ANXIETY     Not Delegated - Psychiatry:  Anxiolytics/Hypnotics Failed - 01/19/2021  7:36 AM      Failed - This refill cannot be delegated      Failed - Urine Drug Screen completed in last 360 days      Passed - Valid encounter within last 6 months    Recent Outpatient Visits           Yesterday Primary hypertension   [facility] [place], Salvadore Oxford, NP   5 months ago Other fatigue   [facility] [place], Salvadore Oxford, [place]

## 2021-02-14 ENCOUNTER — Other Ambulatory Visit: Payer: Self-pay | Admitting: Internal Medicine

## 2021-02-14 DIAGNOSIS — I10 Essential (primary) hypertension: Secondary | ICD-10-CM

## 2021-02-14 NOTE — Telephone Encounter (Signed)
Requested by interface surescripts. Dose changed medication discontinued 01/18/21. Requested Prescriptions  Refused Prescriptions Disp Refills   lisinopril-hydrochlorothiazide (ZESTORETIC) 10-12.5 MG tablet [Pharmacy Med Name: LISINOPRIL-HCTZ 10/12.5MG  TABLETS] 90 tablet 0    Sig: TAKE 1 TABLET BY MOUTH DAILY     Cardiovascular:  ACEI + Diuretic Combos Failed - 02/14/2021  3:37 AM      Failed - Na in normal range and within 180 days    Sodium  Date Value Ref Range Status  08/05/2020 138 135 - 146 mmol/L Final         Failed - K in normal range and within 180 days    Potassium  Date Value Ref Range Status  08/05/2020 4.0 3.5 - 5.3 mmol/L Final         Failed - Cr in normal range and within 180 days    Creat  Date Value Ref Range Status  08/05/2020 0.79 0.50 - 1.03 mg/dL Final         Failed - eGFR is 30 or above and within 180 days    GFR  Date Value Ref Range Status  10/28/2018 66.14 >60.00 mL/min Final   eGFR  Date Value Ref Range Status  08/05/2020 90 > OR = 60 mL/min/1.19m2 Final    Comment:    The eGFR is based on the CKD-EPI 2021 equation. To calculate  the new eGFR from a previous Creatinine or Cystatin C result, go to https://www.kidney.org/professionals/ kdoqi/gfr%5Fcalculator          Failed - Last BP in normal range    BP Readings from Last 1 Encounters:  01/18/21 (!) 166/102         Passed - Patient is not pregnant      Passed - Valid encounter within last 6 months    Recent Outpatient Visits          3 weeks ago Primary hypertension   [place], Coralie Keens, NP   6 months ago Other fatigue   [facility] [place], Coralie Keens, [place]

## 2021-02-15 ENCOUNTER — Other Ambulatory Visit: Payer: Self-pay | Admitting: Internal Medicine

## 2021-02-15 NOTE — Telephone Encounter (Signed)
Requested medication (s) are due for refill today: yes  Requested medication (s) are on the active medication list: yes  Last refill:  01/18/21 #30 with 0 RF  Future visit scheduled: no, was asked to return in 2 weeks, no upcoming appt scheduled.  Notes to clinic:  Did not return in 2 weeks to check new dose of med, please assess.   Requested Prescriptions  Pending Prescriptions Disp Refills   lisinopril-hydrochlorothiazide (ZESTORETIC) 20-25 MG tablet [Pharmacy Med Name: LISINOPRIL-HCTZ 20/25MG  TABLETS] 30 tablet 0    Sig: Take 1 tablet by mouth daily.     Cardiovascular:  ACEI + Diuretic Combos Failed - 02/15/2021  3:37 AM      Failed - Na in normal range and within 180 days    Sodium  Date Value Ref Range Status  08/05/2020 138 135 - 146 mmol/L Final          Failed - K in normal range and within 180 days    Potassium  Date Value Ref Range Status  08/05/2020 4.0 3.5 - 5.3 mmol/L Final          Failed - Cr in normal range and within 180 days    Creat  Date Value Ref Range Status  08/05/2020 0.79 0.50 - 1.03 mg/dL Final          Failed - eGFR is 30 or above and within 180 days    GFR  Date Value Ref Range Status  10/28/2018 66.14 >60.00 mL/min Final   eGFR  Date Value Ref Range Status  08/05/2020 90 > OR = 60 mL/min/1.37m2 Final    Comment:    The eGFR is based on the CKD-EPI 2021 equation. To calculate  the new eGFR from a previous Creatinine or Cystatin C result, go to https://www.kidney.org/professionals/ kdoqi/gfr%5Fcalculator           Failed - Last BP in normal range    BP Readings from Last 1 Encounters:  01/18/21 (!) 166/102          Passed - Patient is not pregnant      Passed - Valid encounter within last 6 months    Recent Outpatient Visits           4 weeks ago Primary hypertension   [place], Coralie Keens, NP   6 months ago Other fatigue   [facility] [place], Coralie Keens, [place]

## 2021-03-02 ENCOUNTER — Other Ambulatory Visit: Payer: Self-pay | Admitting: Internal Medicine

## 2021-03-02 MED ORDER — ALPRAZOLAM 0.5 MG PO TABS: 0.5000 mg | ORAL_TABLET | Freq: Every day | ORAL | 0 refills | Status: DC | PRN

## 2021-03-06 ENCOUNTER — Other Ambulatory Visit: Payer: Self-pay | Admitting: Internal Medicine

## 2021-03-06 MED ORDER — LISINOPRIL-HYDROCHLOROTHIAZIDE 20-25 MG PO TABS: 1.0000 | ORAL_TABLET | Freq: Every day | ORAL | 0 refills | Status: DC

## 2021-04-03 ENCOUNTER — Other Ambulatory Visit: Payer: Self-pay | Admitting: Internal Medicine

## 2021-04-04 NOTE — Telephone Encounter (Signed)
Requested Prescriptions  ?Pending Prescriptions Disp Refills  ?? lisinopril-hydrochlorothiazide (ZESTORETIC) 20-25 MG tablet [Pharmacy Med Name: LISINOPRIL-HCTZ 20/25MG TABLETS] 30 tablet 3  ?  Sig: TAKE 1 TABLET BY MOUTH DAILY  ?  ? Cardiovascular:  ACEI + Diuretic Combos Failed - 04/03/2021  3:38 AM  ?  ?  Failed - Na in normal range and within 180 days  ?  Sodium  ?Date Value Ref Range Status  ?08/05/2020 138 135 - 146 mmol/L Final  ?   ?  ?  Failed - K in normal range and within 180 days  ?  Potassium  ?Date Value Ref Range Status  ?08/05/2020 4.0 3.5 - 5.3 mmol/L Final  ?   ?  ?  Failed - Cr in normal range and within 180 days  ?  Creat  ?Date Value Ref Range Status  ?08/05/2020 0.79 0.50 - 1.03 mg/dL Final  ?   ?  ?  Failed - eGFR is 30 or above and within 180 days  ?  GFR  ?Date Value Ref Range Status  ?10/28/2018 66.14 >60.00 mL/min Final  ? ?eGFR  ?Date Value Ref Range Status  ?08/05/2020 90 > OR = 60 mL/min/1.67m Final  ?  Comment:  ?  The eGFR is based on the CKD-EPI 2021 equation. To calculate  ?the new eGFR from a previous Creatinine or Cystatin C ?result, go to https://www.kidney.org/professionals/ ?kdoqi/gfr%5Fcalculator ?  ?   ?  ?  Failed - Last BP in normal range  ?  BP Readings from Last 1 Encounters:  ?01/18/21 (!) 166/102  ?   ?  ?  Passed - Patient is not pregnant  ?  ?  Passed - Valid encounter within last 6 months  ?  Recent Outpatient Visits   ?      ? 2 months [facility]  ? 8 months [facility]  [facility]  ?  ?  ? ?  ?  ?  ? ?

## 2021-05-04 ENCOUNTER — Other Ambulatory Visit: Payer: Self-pay | Admitting: Internal Medicine

## 2021-05-05 NOTE — Telephone Encounter (Signed)
Requested medication (s) are due for refill today: yes ? ?Requested medication (s) are on the active medication list: yes ? ?Last refill:  04/26/20 ? ?Future visit scheduled: yes ? ?Notes to clinic:  Unable to refill per protocol due to there is not a protocol attached. ? ? ?  ?Requested Prescriptions  ?Pending Prescriptions Disp Refills  ? busPIRone (BUSPAR) 5 MG tablet [Pharmacy Med Name: BUSPIRONE 5MG  TABLETS] 60 tablet 2  ?  Sig: TAKE 1 TABLET(5 MG) BY MOUTH TWICE DAILY  ?  ? There is no refill protocol information for this order  ?  ? ? ?

## 2021-07-01 ENCOUNTER — Other Ambulatory Visit: Payer: Self-pay | Admitting: Internal Medicine

## 2021-07-04 ENCOUNTER — Other Ambulatory Visit: Payer: Self-pay

## 2021-08-21 ENCOUNTER — Other Ambulatory Visit: Payer: Self-pay | Admitting: Internal Medicine

## 2021-08-21 NOTE — Telephone Encounter (Signed)
Requested medication (s) are due for refill today: yes  Requested medication (s) are on the active medication list: yes  Last refill:  03/02/21 #20/0  Future visit scheduled: no  Notes to clinic:  Unable to refill per protocol, cannot delegate.      Requested Prescriptions  Pending Prescriptions Disp Refills   ALPRAZolam (XANAX) 0.5 MG tablet [Pharmacy Med Name: ALPRAZOLAM 0.5MG  TABLETS] 20 tablet     Sig: TAKE 1 TABLET(0.5 MG) BY MOUTH DAILY AS NEEDED FOR ANXIETY     Not Delegated - Psychiatry: Anxiolytics/Hypnotics 2 Failed - 08/21/2021  2:15 PM      Failed - This refill cannot be delegated      Failed - Urine Drug Screen completed in last 360 days      Failed - Valid encounter within last 6 months    Recent Outpatient Visits           7 months ago Primary hypertension   [facility] [place], Salvadore Oxford, NP   1 year ago Other fatigue   [facility], Salvadore Oxford, [place]              Passed - Patient is not pregnant

## 2021-08-22 ENCOUNTER — Encounter: Payer: Self-pay | Admitting: Internal Medicine

## 2021-10-12 ENCOUNTER — Other Ambulatory Visit: Payer: Self-pay | Admitting: Internal Medicine

## 2021-10-12 NOTE — Telephone Encounter (Signed)
Called pt, LVMTCB to schedule OV for med refill.  Requested Prescriptions  Pending Prescriptions Disp Refills  . lisinopril-hydrochlorothiazide (ZESTORETIC) 20-25 MG tablet [Pharmacy Med Name: LISINOPRIL-HCTZ 20/25MG  TABLETS] 30 tablet 0    Sig: TAKE 1 TABLET BY MOUTH DAILY     Cardiovascular:  ACEI + Diuretic Combos Failed - 10/12/2021  3:38 AM      Failed - Na in normal range and within 180 days    Sodium  Date Value Ref Range Status  08/05/2020 138 135 - 146 mmol/L Final         Failed - K in normal range and within 180 days    Potassium  Date Value Ref Range Status  08/05/2020 4.0 3.5 - 5.3 mmol/L Final         Failed - Cr in normal range and within 180 days    Creat  Date Value Ref Range Status  08/05/2020 0.79 0.50 - 1.03 mg/dL Final         Failed - eGFR is 30 or above and within 180 days    GFR  Date Value Ref Range Status  10/28/2018 66.14 >60.00 mL/min Final   eGFR  Date Value Ref Range Status  08/05/2020 90 > OR = 60 mL/min/1.79m2 Final    Comment:    The eGFR is based on the CKD-EPI 2021 equation. To calculate  the new eGFR from a previous Creatinine or Cystatin C result, go to https://www.kidney.org/professionals/ kdoqi/gfr%5Fcalculator          Failed - Last BP in normal range    BP Readings from Last 1 Encounters:  01/18/21 (!) 166/102         Failed - Valid encounter within last 6 months    Recent Outpatient Visits          8 months ago Primary hypertension   [place], Coralie Keens, NP   1 year ago Other fatigue   [place], [place]             Passed - Patient is not pregnant

## 2021-10-26 ENCOUNTER — Ambulatory Visit: Payer: 59 | Admitting: Internal Medicine

## 2021-10-26 ENCOUNTER — Encounter: Payer: Self-pay | Admitting: Internal Medicine

## 2021-10-26 VITALS — BP 122/78 | HR 71 | Temp 97.1°F | Ht 65.0 in | Wt 276.0 lb

## 2021-10-26 DIAGNOSIS — Z0001 Encounter for general adult medical examination with abnormal findings: Secondary | ICD-10-CM | POA: Diagnosis not present

## 2021-10-26 DIAGNOSIS — R61 Generalized hyperhidrosis: Secondary | ICD-10-CM

## 2021-10-26 DIAGNOSIS — Z1211 Encounter for screening for malignant neoplasm of colon: Secondary | ICD-10-CM | POA: Diagnosis not present

## 2021-10-26 DIAGNOSIS — Z1231 Encounter for screening mammogram for malignant neoplasm of breast: Secondary | ICD-10-CM | POA: Diagnosis not present

## 2021-10-26 MED ORDER — VENLAFAXINE HCL ER 37.5 MG PO CP24: 37.5000 mg | ORAL_CAPSULE | Freq: Every day | ORAL | 1 refills | Status: DC

## 2021-10-26 NOTE — Progress Notes (Signed)
Subjective:    Patient ID: Alexis Berger, female    DOB: Oct 17, 1970, 51 y.o.   MRN: [id number]  HPI  Patient presents to clinic today for her annual exam.  Flu: never Tetanus: unsure COVID: never Shingrix: never Pap smear: Hysterectomy Mammogram: 01/2019 Colon screening: never Vision screening: annually Dentist: biannually  Diet: She does eat meat. She consumes some fruits and veggies. She does eat some fried foods. She drinks mostly sweet tea. Exercise: None  Review of Systems     Past Medical History:  Diagnosis Date   Anxiety    Depression    Frequent headaches    Hyperlipidemia     Current Outpatient Medications  Medication Sig Dispense Refill   ALPRAZolam (XANAX) 0.5 MG tablet Take 1 tablet (0.5 mg total) by mouth daily as needed for anxiety. 20 tablet 0   busPIRone (BUSPAR) 5 MG tablet TAKE 1 TABLET(5 MG) BY MOUTH TWICE DAILY 60 tablet 2   ibuprofen (ADVIL,MOTRIN) 200 MG tablet Take 200 mg by mouth as needed.     Insulin Pen Needle (PEN NEEDLES) 30G X 5 MM MISC 1 application by Does not apply route daily. 30 each 0   lisinopril-hydrochlorothiazide (ZESTORETIC) 20-25 MG tablet TAKE 1 TABLET BY MOUTH DAILY 30 tablet 0   SAXENDA 18 MG/3ML SOPN Injection 0.6 mg into skin once daily for 1 week, as tolerated increase by increment of 0.70m every 1 week, max dose is 355minjection daily after 5 weeks. 15 mL 0   No current facility-administered medications for this visit.    No Known Allergies  Family History  Problem Relation Age of Onset   Hypertension Mother    Breast cancer Neg Hx     Social History   Socioeconomic History   Marital status: Married    Spouse name: JaKhari Lett Number of children: Not on file   Years of education: Not on file   Highest education level: Not on file  Occupational History   Not on file  Tobacco Use   Smoking status: Never   Smokeless tobacco: Never  Vaping Use   Vaping Use: Never used  Substance and Sexual Activity    Alcohol use: Not Currently   Drug use: Never   Sexual activity: Yes    Partners: Male  Other Topics Concern   Not on file  Social History Narrative   Not on file   Social Determinants of Health   Financial Resource Strain: Not on file  Food Insecurity: Not on file  Transportation Needs: Not on file  Physical Activity: Not on file  Stress: Not on file  Social Connections: Not on file  Intimate Partner Violence: Not on file     Constitutional: Denies fever, malaise, fatigue, headache or abrupt weight changes.  HEENT: Denies eye pain, eye redness, ear pain, ringing in the ears, wax buildup, runny nose, nasal congestion, bloody nose, or sore throat. Respiratory: Denies difficulty breathing, shortness of breath, cough or sputum production.   Cardiovascular: Patient reports swelling in feet.  Denies chest pain, chest tightness, palpitations or swelling in the hands.  Gastrointestinal: Denies abdominal pain, bloating, constipation, diarrhea or blood in the stool.  GU: Denies urgency, frequency, pain with urination, burning sensation, blood in urine, odor or discharge. Musculoskeletal: Denies decrease in range of motion, difficulty with gait, muscle pain or joint pain and swelling.  Skin: Denies redness, rashes, lesions or ulcercations.  Neurological: Pt reports night sweats. Denies dizziness, difficulty with memory, difficulty with speech  or problems with balance and coordination.  Psych: Patient has a history of anxiety and depression.  Denies SI/HI.  No other specific complaints in a complete review of systems (except as listed in HPI above).  Objective:   Physical Exam   BP 122/78 (BP Location: Right Arm, Patient Position: Sitting, Cuff Size: Large)   Pulse 71   Temp (!) 97.1 F (36.2 C) (Temporal)   Ht _0  (1.651 m)   Wt 276 lb (125.2 kg)   SpO2 99%   BMI 45.93 kg/m   Wt Readings from Last 3 Encounters:  01/18/21 267 lb (121.1 kg)  08/05/20 253 lb 3.2 oz (114.9 kg)   12/11/19 253 lb (114.8 kg)    General: Appears her stated age, obese, in NAD. Skin: Warm, dry and intact. No rashes, lesions or ulcerations noted. HEENT: Head: normal shape and size; Eyes: sclera white, no icterus, conjunctiva pink, PERRLA and EOMs intact;  Neck:  Neck supple, trachea midline. No masses, lumps or thyromegaly present.  Cardiovascular: Normal rate and rhythm. S1,S2 noted.  No murmur, rubs or gallops noted.  1+ BLE edema. No carotid bruits noted. Pulmonary/Chest: Normal effort and positive vesicular breath sounds. No respiratory distress. No wheezes, rales or ronchi noted.  Abdomen: Normal bowel sounds.  Musculoskeletal: Strength 5/5 BUE/BLE no difficulty with gait.  Neurological: Alert and oriented. Cranial nerves II-XII grossly intact. Coordination normal.  Psychiatric: Mood and affect normal. Behavior is normal. Judgment and thought content normal.    BMET    Component Value Date/Time   NA 138 08/05/2020 1535   K 4.0 08/05/2020 1535   CL 98 08/05/2020 1535   CO2 30 08/05/2020 1535   GLUCOSE 122 08/05/2020 1535   BUN 12 08/05/2020 1535   CREATININE 0.79 08/05/2020 1535   CALCIUM 9.5 08/05/2020 1535    Lipid Panel     Component Value Date/Time   CHOL 209 (H) 08/05/2020 1535   TRIG 185 (H) 08/05/2020 1535   HDL 51 08/05/2020 1535   CHOLHDL 4.1 08/05/2020 1535   VLDL 67.0 (H) 10/28/2018 1555   LDLCALC 127 (H) 08/05/2020 1535    CBC    Component Value Date/Time   WBC 9.4 12/11/2019 1413   RBC 5.05 12/11/2019 1413   HGB 14.4 12/11/2019 1413   HGB 8.3 (L) 03/26/2011 1155   HCT 41.9 12/11/2019 1413   HCT 31.5 (L) 03/27/2011 0559   PLT 243 12/11/2019 1413   MCV 83.0 12/11/2019 1413   MCH 28.5 12/11/2019 1413   MCHC 34.4 12/11/2019 1413   RDW 13.5 12/11/2019 1413    Hgb A1C Lab Results  Component Value Date   HGBA1C 5.7 (H) 08/05/2020           Assessment & Plan:   Preventative Health Maintenance:  She declines flu shot She declines  tetanus Encouraged her to get her COVID-vaccine Discussed Shingrix vaccine, she will check coverage with her insurance company and schedule a nurse visit if she would like to have this done She no longer needs Pap smears Mammogram ordered Referral to GI for screening colonoscopy Encouraged her to consume a balanced diet and exercise regimen Advised to see an eye doctor and dentist annually We will check CBC, c-Met, lipid and A1c today  Night sweats:  We will check FSH/LH today We will start venlafaxine 37.5 mg daily   RTC in 6 months, follow-up chronic conditions Webb Silversmith, NP

## 2021-10-26 NOTE — Assessment & Plan Note (Signed)
Encourage diet and exercise for weight loss 

## 2021-10-26 NOTE — Patient Instructions (Signed)

## 2021-10-27 ENCOUNTER — Other Ambulatory Visit: Payer: Self-pay | Admitting: Internal Medicine

## 2021-10-27 ENCOUNTER — Telehealth: Payer: Self-pay | Admitting: *Deleted

## 2021-10-27 ENCOUNTER — Other Ambulatory Visit: Payer: Self-pay | Admitting: *Deleted

## 2021-10-27 DIAGNOSIS — Z1211 Encounter for screening for malignant neoplasm of colon: Secondary | ICD-10-CM

## 2021-10-27 LAB — CBC
HCT: 44 % (ref 35.0–45.0)
Hemoglobin: 14.8 g/dL (ref 11.7–15.5)
MCH: 28.9 pg (ref 27.0–33.0)
MCHC: 33.6 g/dL (ref 32.0–36.0)
MCV: 85.9 fL (ref 80.0–100.0)
MPV: 10.9 fL (ref 7.5–12.5)
Platelets: 262 10*3/uL (ref 140–400)
RBC: 5.12 10*6/uL — ABNORMAL HIGH (ref 3.80–5.10)
RDW: 13 % (ref 11.0–15.0)
WBC: 8.2 10*3/uL (ref 3.8–10.8)

## 2021-10-27 LAB — COMPLETE METABOLIC PANEL WITH GFR
AG Ratio: 1.5 (calc) (ref 1.0–2.5)
ALT: 25 U/L (ref 6–29)
AST: 19 U/L (ref 10–35)
Albumin: 4.2 g/dL (ref 3.6–5.1)
Alkaline phosphatase (APISO): 111 U/L (ref 37–153)
BUN: 9 mg/dL (ref 7–25)
CO2: 29 mmol/L (ref 20–32)
Calcium: 9.3 mg/dL (ref 8.6–10.4)
Chloride: 100 mmol/L (ref 98–110)
Creat: 0.74 mg/dL (ref 0.50–1.03)
Globulin: 2.8 g/dL (calc) (ref 1.9–3.7)
Glucose, Bld: 124 mg/dL (ref 65–139)
Potassium: 4.1 mmol/L (ref 3.5–5.3)
Sodium: 137 mmol/L (ref 135–146)
Total Bilirubin: 0.4 mg/dL (ref 0.2–1.2)
Total Protein: 7 g/dL (ref 6.1–8.1)
eGFR: 97 mL/min/{1.73_m2} (ref >=60)

## 2021-10-27 LAB — FSH/LH
FSH: 3.1 m[IU]/mL
LH: 3.2 m[IU]/mL

## 2021-10-27 LAB — HEMOGLOBIN A1C
Hgb A1c MFr Bld: 6 % of total Hgb — ABNORMAL HIGH (ref <5.7)
Mean Plasma Glucose: 126 mg/dL
eAG (mmol/L): 7 mmol/L

## 2021-10-27 LAB — LIPID PANEL
Cholesterol: 186 mg/dL (ref <200)
HDL: 51 mg/dL (ref >=50)
LDL Cholesterol (Calc): 100 mg/dL (calc) — ABNORMAL HIGH
Non-HDL Cholesterol (Calc): 135 mg/dL (calc) — ABNORMAL HIGH (ref <130)
Total CHOL/HDL Ratio: 3.6 (calc) (ref <5.0)
Triglycerides: 229 mg/dL — ABNORMAL HIGH (ref <150)

## 2021-10-27 MED ORDER — NA SULFATE-K SULFATE-MG SULF 17.5-3.13-1.6 GM/177ML PO SOLN: ORAL | 0 refills | Status: AC

## 2021-10-27 NOTE — Telephone Encounter (Signed)
Gastroenterology Pre-Procedure Review  Request Date: 12/08/2021 Requesting Physician: Dr. Allen Norris  PATIENT REVIEW QUESTIONS: The patient responded to the following health history questions as indicated:    1. Are you having any GI issues? no 2. Do you have a personal history of Polyps? no 3. Do you have a family history of Colon Cancer or Polyps? no 4. Diabetes Mellitus? no 5. Joint replacements in the past 12 months?no 6. Major health problems in the past 3 months?no 7. Any artificial heart valves, MVP, or defibrillator?no    MEDICATIONS & ALLERGIES:    Patient reports the following regarding taking any anticoagulation/antiplatelet therapy:   Plavix, Coumadin, Eliquis, Xarelto, Lovenox, Pradaxa, Brilinta, or Effient? no Aspirin? no  Noted - patient does not take Saxenda  Patient confirms/reports the following medications:  Current Outpatient Medications  Medication Sig Dispense Refill   ALPRAZolam (XANAX) 0.5 MG tablet Take 1 tablet (0.5 mg total) by mouth daily as needed for anxiety. 20 tablet 0   ibuprofen (ADVIL,MOTRIN) 200 MG tablet Take 200 mg by mouth as needed.     Insulin Pen Needle (PEN NEEDLES) 30G X 5 MM MISC 1 application by Does not apply route daily. 30 each 0   lisinopril-hydrochlorothiazide (ZESTORETIC) 20-25 MG tablet TAKE 1 TABLET BY MOUTH DAILY 30 tablet 0   SAXENDA 18 MG/3ML SOPN Injection 0.6 mg into skin once daily for 1 week, as tolerated increase by increment of 0.6mg  every 1 week, max dose is 3mg  injection daily after 5 weeks. 15 mL 0   venlafaxine XR (EFFEXOR XR) 37.5 MG 24 hr capsule Take 1 capsule (37.5 mg total) by mouth daily with breakfast. 90 capsule 1   No current facility-administered medications for this visit.    Patient confirms/reports the following allergies:  No Known Allergies  No orders of the defined types were placed in this encounter.   AUTHORIZATION INFORMATION Primary Insurance: 1D#: Group #:  Secondary Insurance: 1D#: Group  #:  SCHEDULE INFORMATION: Date: 12/08/2021 Time: Location:[facility]

## 2021-10-27 NOTE — Telephone Encounter (Signed)
Requested medication (s) are due for refill today: yes  Requested medication (s) are on the active medication list: yes  Last refill:  03/02/21  Future visit scheduled: no  Notes to clinic:  med not delegated to NT to reorder   Requested Prescriptions  Pending Prescriptions Disp Refills   ALPRAZolam (XANAX) 0.5 MG tablet [Pharmacy Med Name: ALPRAZOLAM 0.5MG  TABLETS] 20 tablet     Sig: TAKE 1 TABLET(0.5 MG) BY MOUTH DAILY AS NEEDED FOR ANXIETY     Not Delegated - Psychiatry: Anxiolytics/Hypnotics 2 Failed - 10/27/2021  9:48 AM      Failed - This refill cannot be delegated      Failed - Urine Drug Screen completed in last 360 days      Passed - Patient is not pregnant      Passed - Valid encounter within last 6 months    Recent Outpatient Visits           Yesterday Encounter for general adult medical examination with abnormal findings   [place], Coralie Keens, NP   9 months ago Primary hypertension   [place], Coralie Keens, NP   1 year ago Other fatigue   [facility] [place], Coralie Keens, NP

## 2021-10-30 ENCOUNTER — Telehealth: Payer: Self-pay

## 2021-10-30 ENCOUNTER — Encounter: Payer: Self-pay | Admitting: Internal Medicine

## 2021-10-30 DIAGNOSIS — I10 Essential (primary) hypertension: Secondary | ICD-10-CM

## 2021-10-30 NOTE — Telephone Encounter (Signed)
Copied from [place] [id number]. Topic: General - Inquiry >> Oct 27, 2021  9:33 AM Penni Bombard wrote: Reason for CRM: Pt called saying she was in yesterday to talk about her feet and ankles swelling.  She said she thought Rollene Fare was going to change her BP medication.  She didn't see but one prescription listed for something different that they discussed  CB@  [phone]

## 2021-10-31 MED ORDER — FUROSEMIDE 20 MG PO TABS: 20.0000 mg | ORAL_TABLET | Freq: Every day | ORAL | 1 refills | Status: DC

## 2021-10-31 MED ORDER — LISINOPRIL 20 MG PO TABS: 20.0000 mg | ORAL_TABLET | Freq: Every day | ORAL | 1 refills | Status: DC

## 2021-10-31 NOTE — Telephone Encounter (Signed)
See My Chart Message    Thanks,   -Quinnie Barcelo  

## 2021-10-31 NOTE — Addendum Note (Signed)
Addended by: Jearld Fenton on: 10/31/2021 07:52 AM   Modules accepted: Orders

## 2021-10-31 NOTE — Telephone Encounter (Signed)
I was writing to review her labs which I did not do until yesterday.  I have discontinued her lisinopril HCT.  I have sent in a Rx for lisinopril 20 mg daily and furosemide 20 mg daily.  She should start this and let me know if the swelling does not improve.  We should check her kidney function and potassium in 2 weeks after starting these medications.  Please have her schedule a lab only appointment.

## 2021-11-02 ENCOUNTER — Ambulatory Visit
Admission: RE | Admit: 2021-11-02 | Discharge: 2021-11-02 | Disposition: A | Discharge status: Home / Self Care | Payer: 59 | Source: Ambulatory Visit | Attending: Internal Medicine | Admitting: Internal Medicine

## 2021-11-02 DIAGNOSIS — Z1231 Encounter for screening mammogram for malignant neoplasm of breast: Secondary | ICD-10-CM | POA: Diagnosis present

## 2021-11-15 ENCOUNTER — Other Ambulatory Visit: Payer: Self-pay

## 2021-11-15 DIAGNOSIS — I10 Essential (primary) hypertension: Secondary | ICD-10-CM

## 2021-11-16 ENCOUNTER — Other Ambulatory Visit: Payer: 59

## 2021-11-17 LAB — BASIC METABOLIC PANEL WITH GFR
BUN: 11 mg/dL (ref 7–25)
CO2: 27 mmol/L (ref 20–32)
Calcium: 9.3 mg/dL (ref 8.6–10.4)
Chloride: 100 mmol/L (ref 98–110)
Creat: 0.72 mg/dL (ref 0.50–1.03)
Glucose, Bld: 126 mg/dL — ABNORMAL HIGH (ref 65–99)
Potassium: 3.8 mmol/L (ref 3.5–5.3)
Sodium: 137 mmol/L (ref 135–146)
eGFR: 100 mL/min/{1.73_m2} (ref >=60)

## 2022-01-03 ENCOUNTER — Encounter: Payer: Self-pay | Admitting: Anesthesiology

## 2022-01-03 ENCOUNTER — Encounter: Payer: Self-pay | Admitting: Gastroenterology

## 2022-01-09 ENCOUNTER — Telehealth: Payer: Self-pay | Admitting: Gastroenterology

## 2022-01-09 NOTE — Telephone Encounter (Signed)
Message left for patient to return my call.  

## 2022-01-09 NOTE — Telephone Encounter (Signed)
Patient needs to cancel upcoming procedure. States she has to take her mother to a doctors appointment that day. Also states she will call back later to reschedule.

## 2022-01-10 NOTE — Telephone Encounter (Signed)
Message left for patient to return my call.  

## 2022-01-12 ENCOUNTER — Ambulatory Visit: Admission: RE | Admit: 2022-01-12 | Payer: 59 | Source: Home / Self Care | Admitting: Gastroenterology

## 2022-01-12 HISTORY — DX: Presence of dental prosthetic device (complete) (partial): Z97.2

## 2022-01-12 HISTORY — DX: Essential (primary) hypertension: I10

## 2022-01-12 SURGERY — COLONOSCOPY WITH PROPOFOL
Anesthesia: Choice

## 2022-01-17 NOTE — Telephone Encounter (Signed)
Patient spoken to Maudie Mercury at [facility] surgery center and confirm cancellation.

## 2022-04-19 ENCOUNTER — Other Ambulatory Visit: Payer: Self-pay | Admitting: Internal Medicine

## 2022-04-19 NOTE — Telephone Encounter (Signed)
Requested Prescriptions  Pending Prescriptions Disp Refills   venlafaxine XR (EFFEXOR-XR) 37.5 MG 24 hr capsule [Pharmacy Med Name: VENLAFAXINE ER 37.5MG  CAPSULES] 90 capsule 0    Sig: TAKE 1 CAPSULE(37.5 MG) BY MOUTH DAILY WITH BREAKFAST     Psychiatry: Antidepressants - SNRI - desvenlafaxine & venlafaxine Failed - 04/19/2022  6:23 AM      Failed - Completed PHQ-2 or PHQ-9 in the last 360 days      Failed - Lipid Panel in normal range within the last 12 months    Cholesterol  Date Value Ref Range Status  10/26/2021 186 <200 mg/dL Final   LDL Cholesterol (Calc)  Date Value Ref Range Status  10/26/2021 100 (H) mg/dL (calc) Final    Comment:    Reference range: <100 . Desirable range <100 mg/dL for primary prevention;   <70 mg/dL for patients with CHD or diabetic patients  with > or = 2 CHD risk factors. Marland Kitchen LDL-C is now calculated using the Martin-Hopkins  calculation, which is a validated novel method providing  better accuracy than the Friedewald equation in the  estimation of LDL-C.  Horald Pollen et al. Lenox Ahr. 0211;173(56): 2061-2068  (http://education.Quest[employer].com/faq/FAQ164)    Direct LDL  Date Value Ref Range Status  10/28/2018 133.0 mg/dL Final    Comment:    Optimal:  <100 mg/dLNear or Above Optimal:  100-129 mg/dLBorderline High:  130-159 mg/dLHigh:  160-189 mg/dLVery High:  >190 mg/dL   HDL  Date Value Ref Range Status  10/26/2021 51 > OR = 50 mg/dL Final   Triglycerides  Date Value Ref Range Status  10/26/2021 229 (H) <150 mg/dL Final    Comment:    . If a non-fasting specimen was collected, consider repeat triglyceride testing on a fasting specimen if clinically indicated.  Perry Mount et al. J. of Clin. Lipidol. 2015;9:129-169. Marland Kitchen          Passed - Cr in normal range and within 360 days    Creat  Date Value Ref Range Status  11/16/2021 0.72 0.50 - 1.03 mg/dL Final         Passed - Last BP in normal range    BP Readings from Last 1 Encounters:   10/26/21 122/78         Passed - Valid encounter within last 6 months    Recent Outpatient Visits           5 months ago Encounter for general adult medical examination with abnormal findings   [place] [facility], Salvadore Oxford, NP   1 year ago Primary hypertension   [place] [facility] [place], [place] W, NP   1 year ago Other fatigue   [place] [facility] [place], Salvadore Oxford, NP               furosemide (LASIX) 20 MG tablet [Pharmacy Med Name: FUROSEMIDE 20MG  TABLETS] 90 tablet 0    Sig: TAKE 1 TABLET(20 MG) BY MOUTH DAILY     Cardiovascular:  Diuretics - Loop Failed - 04/19/2022  6:23 AM      Failed - Mg Level in normal range and within 180 days    No results found for: "MG"       Passed - K in normal range and within 180 days    Potassium  Date Value Ref Range Status  11/16/2021 3.8 3.5 - 5.3 mmol/L Final         Passed - Ca in normal  range and within 180 days    Calcium  Date Value Ref Range Status  11/16/2021 9.3 8.6 - 10.4 mg/dL Final         Passed - Na in normal range and within 180 days    Sodium  Date Value Ref Range Status  11/16/2021 137 135 - 146 mmol/L Final         Passed - Cr in normal range and within 180 days    Creat  Date Value Ref Range Status  11/16/2021 0.72 0.50 - 1.03 mg/dL Final         Passed - Cl in normal range and within 180 days    Chloride  Date Value Ref Range Status  11/16/2021 100 98 - 110 mmol/L Final         Passed - Last BP in normal range    BP Readings from Last 1 Encounters:  10/26/21 122/78         Passed - Valid encounter within last 6 months    Recent Outpatient Visits           5 months ago Encounter for general adult medical examination with abnormal findings   [place] [facility] [place], Salvadore Oxford, NP   1 year ago Primary hypertension   [place] [facility] [place], [place] W, NP   1 year ago Other fatigue    [place] [facility] [place], [place] W, NP               lisinopril (ZESTRIL) 20 MG tablet [Pharmacy Med Name: LISINOPRIL 20MG  TABLETS] 90 tablet 0    Sig: TAKE 1 TABLET(20 MG) BY MOUTH DAILY     Cardiovascular:  ACE Inhibitors Passed - 04/19/2022  6:23 AM      Passed - Cr in normal range and within 180 days    Creat  Date Value Ref Range Status  11/16/2021 0.72 0.50 - 1.03 mg/dL Final         Passed - K in normal range and within 180 days    Potassium  Date Value Ref Range Status  11/16/2021 3.8 3.5 - 5.3 mmol/L Final         Passed - Patient is not pregnant      Passed - Last BP in normal range    BP Readings from Last 1 Encounters:  10/26/21 122/78         Passed - Valid encounter within last 6 months    Recent Outpatient Visits           5 months ago Encounter for general adult medical examination with abnormal findings   [place] [facility] [place], Salvadore Oxford, NP   1 year ago Primary hypertension   [place] [facility] [place], Salvadore Oxford, NP   1 year ago Other fatigue   [place] [facility] [place], Salvadore Oxford, [place]

## 2022-07-02 ENCOUNTER — Other Ambulatory Visit: Payer: Self-pay | Admitting: Internal Medicine

## 2022-07-18 ENCOUNTER — Other Ambulatory Visit: Payer: Self-pay | Admitting: Internal Medicine

## 2022-07-18 NOTE — Telephone Encounter (Signed)
OV needed for additional refills.  Requested Prescriptions  Pending Prescriptions Disp Refills   venlafaxine XR (EFFEXOR-XR) 37.5 MG 24 hr capsule [Pharmacy Med Name: VENLAFAXINE ER 37.5MG  CAPSULES] 90 capsule 0    Sig: TAKE 1 CAPSULE(37.5 MG) BY MOUTH DAILY WITH BREAKFAST     Psychiatry: Antidepressants - SNRI - desvenlafaxine & venlafaxine Failed - 07/18/2022  6:23 AM      Failed - Completed PHQ-2 or PHQ-9 in the last 360 days      Failed - Valid encounter within last 6 months    Recent Outpatient Visits           8 months ago Encounter for general adult medical examination with abnormal findings   [place], Salvadore [place], NP   1 year ago Primary hypertension   [place] [facility] [place], Salvadore Oxford, NP   1 year ago Other fatigue   [place] [facility] [place], [place] W, NP              Failed - Lipid Panel in normal range within the last 12 months    Cholesterol  Date Value Ref Range Status  10/26/2021 186 <200 mg/dL Final   LDL Cholesterol (Calc)  Date Value Ref Range Status  10/26/2021 100 (H) mg/dL (calc) Final    Comment:    Reference range: <100 . Desirable range <100 mg/dL for primary prevention;   <70 mg/dL for patients with CHD or diabetic patients  with > or = 2 CHD risk factors. Marland Kitchen LDL-C is now calculated using the Martin-Hopkins  calculation, which is a validated novel method providing  better accuracy than the Friedewald equation in the  estimation of LDL-C.  Horald Pollen et al. Lenox Ahr. 1610;960(45): 2061-2068  (http://education.[facility].com/faq/FAQ164)    Direct LDL  Date Value Ref Range Status  10/28/2018 133.0 mg/dL Final    Comment:    Optimal:  <100 mg/dLNear or Above Optimal:  100-129 mg/dLBorderline High:  130-159 mg/dLHigh:  160-189 mg/dLVery High:  >190 mg/dL   HDL  Date Value Ref Range Status  10/26/2021 51 > OR = 50 mg/dL Final   Triglycerides  Date Value Ref Range  Status  10/26/2021 229 (H) <150 mg/dL Final    Comment:    . If a non-fasting specimen was collected, consider repeat triglyceride testing on a fasting specimen if clinically indicated.  Perry Mount et al. J. of Clin. Lipidol. 2015;9:129-169. Marland Kitchen          Passed - Cr in normal range and within 360 days    Creat  Date Value Ref Range Status  11/16/2021 0.72 0.50 - 1.03 mg/dL Final         Passed - Last BP in normal range    BP Readings from Last 1 Encounters:  10/26/21 122/78          furosemide (LASIX) 20 MG tablet [Pharmacy Med Name: FUROSEMIDE 20MG  TABLETS] 90 tablet 0    Sig: TAKE 1 TABLET(20 MG) BY MOUTH DAILY     Cardiovascular:  Diuretics - Loop Failed - 07/18/2022  6:23 AM      Failed - K in normal range and within 180 days    Potassium  Date Value Ref Range Status  11/16/2021 3.8 3.5 - 5.3 mmol/L Final         Failed - Ca in normal range and within 180 days    Calcium  Date Value Ref Range Status  11/16/2021 9.3 8.6 -  10.4 mg/dL Final         Failed - Na in normal range and within 180 days    Sodium  Date Value Ref Range Status  11/16/2021 137 135 - 146 mmol/L Final         Failed - Cr in normal range and within 180 days    Creat  Date Value Ref Range Status  11/16/2021 0.72 0.50 - 1.03 mg/dL Final         Failed - Cl in normal range and within 180 days    Chloride  Date Value Ref Range Status  11/16/2021 100 98 - 110 mmol/L Final         Failed - Mg Level in normal range and within 180 days    No results found for: "MG"       Failed - Valid encounter within last 6 months    Recent Outpatient Visits           8 months ago Encounter for general adult medical examination with abnormal findings   [place] [facility] [place], [place] W, NP   1 year ago Primary hypertension   [place] [facility] [place], [place], NP   1 year ago Other fatigue   [place] [facility] [place], [place], NP               Passed - Last BP in normal range    BP Readings from Last 1 Encounters:  10/26/21 122/78          lisinopril (ZESTRIL) 20 MG tablet [Pharmacy Med Name: LISINOPRIL 20MG  TABLETS] 90 tablet 0    Sig: TAKE 1 TABLET(20 MG) BY MOUTH DAILY     Cardiovascular:  ACE Inhibitors Failed - 07/18/2022  6:23 AM      Failed - Cr in normal range and within 180 days    Creat  Date Value Ref Range Status  11/16/2021 0.72 0.50 - 1.03 mg/dL Final         Failed - K in normal range and within 180 days    Potassium  Date Value Ref Range Status  11/16/2021 3.8 3.5 - 5.3 mmol/L Final         Failed - Valid encounter within last 6 months    Recent Outpatient Visits           8 months ago Encounter for general adult medical examination with abnormal findings   [facility] [place], Salvadore Oxford, NP   1 year ago Primary hypertension   [place] [facility] [place], Salvadore Oxford, NP   1 year ago Other fatigue   [place] [facility] [place], [place], [place]              [id number] - Patient is not pregnant      Passed - Last BP in normal range    BP Readings from Last 1 Encounters:  10/26/21 122/78

## 2022-10-16 ENCOUNTER — Other Ambulatory Visit: Payer: Self-pay | Admitting: Internal Medicine

## 2022-10-16 NOTE — Telephone Encounter (Signed)
Requested Prescriptions  Pending Prescriptions Disp Refills   furosemide (LASIX) 20 MG tablet [Pharmacy Med Name: FUROSEMIDE 20MG  TABLETS] 30 tablet 0    Sig: TAKE 1 TABLET(20 MG) BY MOUTH DAILY     Cardiovascular:  Diuretics - Loop Failed - 10/16/2022  6:22 AM      Failed - K in normal range and within 180 days    Potassium  Date Value Ref Range Status  11/16/2021 3.8 3.5 - 5.3 mmol/L Final         Failed - Ca in normal range and within 180 days    Calcium  Date Value Ref Range Status  11/16/2021 9.3 8.6 - 10.4 mg/dL Final         Failed - Na in normal range and within 180 days    Sodium  Date Value Ref Range Status  11/16/2021 137 135 - 146 mmol/L Final         Failed - Cr in normal range and within 180 days    Creat  Date Value Ref Range Status  11/16/2021 0.72 0.50 - 1.03 mg/dL Final         Failed - Cl in normal range and within 180 days    Chloride  Date Value Ref Range Status  11/16/2021 100 98 - 110 mmol/L Final         Failed - Mg Level in normal range and within 180 days    No results found for: "MG"       Failed - Valid encounter within last 6 months    Recent Outpatient Visits           11 months ago Encounter for general adult medical examination with abnormal findings   [place] [facility] [place], Salvadore Oxford, NP   1 year ago Primary hypertension   [place] [facility] [place], Salvadore Oxford, NP   2 years ago Other fatigue   [place] [facility] [place], [place] W, NP              Passed - Last BP in normal range    BP Readings from Last 1 Encounters:  10/26/21 122/78          venlafaxine XR (EFFEXOR-XR) 37.5 MG 24 hr capsule [Pharmacy Med Name: VENLAFAXINE ER 37.5MG  CAPSULES] 30 capsule 0    Sig: TAKE 1 CAPSULE(37.5 MG) BY MOUTH DAILY WITH BREAKFAST     Psychiatry: Antidepressants - SNRI - desvenlafaxine & venlafaxine Failed - 10/16/2022  6:22 AM      Failed - Completed PHQ-2 or PHQ-9 in the  last 360 days      Failed - Valid encounter within last 6 months    Recent Outpatient Visits           11 months ago Encounter for general adult medical examination with abnormal findings   [place] [facility] [place], Salvadore Oxford, NP   1 year ago Primary hypertension   [place] [facility], Salvadore [place], NP   2 years ago Other fatigue   [place] [facility] [place], [place], NP              Failed - Lipid Panel in normal range within the last 12 months    Cholesterol  Date Value Ref Range Status  10/26/2021 186 <200 mg/dL Final   LDL Cholesterol (Calc)  Date Value Ref Range Status  10/26/2021 100 (H) mg/dL (  calc) Final    Comment:    Reference range: <100 . Desirable range <100 mg/dL for primary prevention;   <70 mg/dL for patients with CHD or diabetic patients  with > or = 2 CHD risk factors. Marland Kitchen LDL-C is now calculated using the Martin-Hopkins  calculation, which is a validated novel method providing  better accuracy than the Friedewald equation in the  estimation of LDL-C.  Horald Pollen et al. Lenox Ahr. 3086;578(46): 2061-2068  (http://education.[facility].com/faq/FAQ164)    Direct LDL  Date Value Ref Range Status  10/28/2018 133.0 mg/dL Final    Comment:    Optimal:  <100 mg/dLNear or Above Optimal:  100-129 mg/dLBorderline High:  130-159 mg/dLHigh:  160-189 mg/dLVery High:  >190 mg/dL   HDL  Date Value Ref Range Status  10/26/2021 51 > OR = 50 mg/dL Final   Triglycerides  Date Value Ref Range Status  10/26/2021 229 (H) <150 mg/dL Final    Comment:    . If a non-fasting specimen was collected, consider repeat triglyceride testing on a fasting specimen if clinically indicated.  Perry Mount et al. J. of Clin. Lipidol. 2015;9:129-169. Marland Kitchen          Passed - Cr in normal range and within 360 days    Creat  Date Value Ref Range Status  11/16/2021 0.72 0.50 - 1.03 mg/dL Final         Passed - Last BP in  normal range    BP Readings from Last 1 Encounters:  10/26/21 122/78          lisinopril (ZESTRIL) 20 MG tablet [Pharmacy Med Name: LISINOPRIL 20MG  TABLETS] 30 tablet 0    Sig: TAKE 1 TABLET(20 MG) BY MOUTH DAILY     Cardiovascular:  ACE Inhibitors Failed - 10/16/2022  6:22 AM      Failed - Cr in normal range and within 180 days    Creat  Date Value Ref Range Status  11/16/2021 0.72 0.50 - 1.03 mg/dL Final         Failed - K in normal range and within 180 days    Potassium  Date Value Ref Range Status  11/16/2021 3.8 3.5 - 5.3 mmol/L Final         Failed - Valid encounter within last 6 months    Recent Outpatient Visits           11 months ago Encounter for general adult medical examination with abnormal findings   [place] [facility] [place], Salvadore Oxford, NP   1 year ago Primary hypertension    [facility] [place], Salv[place], NP   2 years ago Other fatigue    [facility] [place], [place], [place]              [id number] - Patient is not pregnant      Passed - Last BP in normal range    BP Readings from Last 1 Encounters:  10/26/21 122/78          Courtesy refill.

## 2022-11-15 ENCOUNTER — Other Ambulatory Visit: Payer: Self-pay | Admitting: Internal Medicine

## 2022-11-15 DIAGNOSIS — Z1231 Encounter for screening mammogram for malignant neoplasm of breast: Secondary | ICD-10-CM

## 2022-11-19 ENCOUNTER — Ambulatory Visit: Payer: 59 | Admitting: Internal Medicine

## 2022-11-19 ENCOUNTER — Encounter: Payer: Self-pay | Admitting: Internal Medicine

## 2022-11-19 VITALS — BP 132/78 | Ht 65.0 in | Wt 276.0 lb

## 2022-11-19 DIAGNOSIS — Z1211 Encounter for screening for malignant neoplasm of colon: Secondary | ICD-10-CM

## 2022-11-19 DIAGNOSIS — E7800 Pure hypercholesterolemia, unspecified: Secondary | ICD-10-CM | POA: Diagnosis not present

## 2022-11-19 DIAGNOSIS — Z0001 Encounter for general adult medical examination with abnormal findings: Secondary | ICD-10-CM | POA: Diagnosis not present

## 2022-11-19 DIAGNOSIS — R7303 Prediabetes: Secondary | ICD-10-CM | POA: Diagnosis not present

## 2022-11-19 DIAGNOSIS — Z23 Encounter for immunization: Secondary | ICD-10-CM | POA: Diagnosis not present

## 2022-11-19 DIAGNOSIS — E66813 Obesity, class 3: Secondary | ICD-10-CM

## 2022-11-19 DIAGNOSIS — Z6841 Body Mass Index (BMI) 40.0 and over, adult: Secondary | ICD-10-CM

## 2022-11-19 MED ORDER — FUROSEMIDE 20 MG PO TABS: 40.0000 mg | ORAL_TABLET | Freq: Every day | ORAL | 1 refills | Status: DC

## 2022-11-19 MED ORDER — LISINOPRIL 20 MG PO TABS: 20.0000 mg | ORAL_TABLET | Freq: Every day | ORAL | 1 refills | Status: DC

## 2022-11-19 MED ORDER — VENLAFAXINE HCL ER 37.5 MG PO CP24: 37.5000 mg | ORAL_CAPSULE | Freq: Every day | ORAL | 1 refills | Status: DC

## 2022-11-19 NOTE — Patient Instructions (Signed)
Prediabetes Eating Plan Prediabetes is a condition that causes blood sugar (glucose) levels to be higher than normal. This increases the risk for developing type 2 diabetes (type 2 diabetes mellitus). Working with a health care provider or nutrition specialist (dietitian) to make diet and lifestyle changes can help prevent the onset of diabetes. These changes may help you: Control your blood glucose levels. Improve your cholesterol levels. Manage your blood pressure. What are tips for following this plan? Reading food labels Read food labels to check the amount of fat, salt (sodium), and sugar in prepackaged foods. Avoid foods that have: Saturated fats. Trans fats. Added sugars. Avoid foods that have more than 300 milligrams (mg) of sodium per serving. Limit your sodium intake to less than 2,300 mg each day. Shopping Avoid buying pre-made and processed foods. Avoid buying drinks with added sugar. Cooking Cook with olive oil. Do not use butter, lard, or ghee. Bake, broil, grill, steam, or boil foods. Avoid frying. Meal planning  Work with your dietitian to create an eating plan that is right for you. This may include tracking how many calories you take in each day. Use a food diary, notebook, or mobile application to track what you eat at each meal. Consider following a Mediterranean diet. This includes: Eating several servings of fresh fruits and vegetables each day. Eating fish at least twice a week. Eating one serving each day of whole grains, beans, nuts, and seeds. Using olive oil instead of other fats. Limiting alcohol. Limiting red meat. Using nonfat or low-fat dairy products. Consider following a plant-based diet. This includes dietary choices that focus on eating mostly vegetables and fruit, grains, beans, nuts, and seeds. If you have high blood pressure, you may need to limit your sodium intake or follow a diet such as the DASH (Dietary Approaches to Stop Hypertension) eating  plan. The DASH diet aims to lower high blood pressure. Lifestyle Set weight loss goals with help from your health care team. It is recommended that most people with prediabetes lose 7% of their body weight. Exercise for at least 30 minutes 5 or more days a week. Attend a support group or seek support from a mental health counselor. Take over-the-counter and prescription medicines only as told by your health care provider. What foods are recommended? Fruits Berries. Bananas. Apples. Oranges. Grapes. Papaya. Mango. Pomegranate. Kiwi. Grapefruit. Cherries. Vegetables Lettuce. Spinach. Peas. Beets. Cauliflower. Cabbage. Broccoli. Carrots. Tomatoes. Squash. Eggplant. Herbs. Peppers. Onions. Cucumbers. Brussels sprouts. Grains Whole grains, such as whole-wheat or whole-grain breads, crackers, cereals, and pasta. Unsweetened oatmeal. Bulgur. Barley. Quinoa. Brown rice. Corn or whole-wheat flour tortillas or taco shells. Meats and other proteins Seafood. Poultry without skin. Lean cuts of pork and beef. Tofu. Eggs. Nuts. Beans. Dairy Low-fat or fat-free dairy products, such as yogurt, cottage cheese, and cheese. Beverages Water. Tea. Coffee. Sugar-free or diet soda. Seltzer water. Low-fat or nonfat milk. Milk alternatives, such as soy or almond milk. Fats and oils Olive oil. Canola oil. Sunflower oil. Grapeseed oil. Avocado. Walnuts. Sweets and desserts Sugar-free or low-fat pudding. Sugar-free or low-fat ice cream and other frozen treats. Seasonings and condiments Herbs. Sodium-free spices. Mustard. Relish. Low-salt, low-sugar ketchup. Low-salt, low-sugar barbecue sauce. Low-fat or fat-free mayonnaise. The items listed above may not be a complete list of recommended foods and beverages. Contact a dietitian for more information. What foods are not recommended? Fruits Fruits canned with syrup. Vegetables Canned vegetables. Frozen vegetables with butter or cream sauce. Grains Refined white  flour and flour   products, such as bread, pasta, snack foods, and cereals. Meats and other proteins Fatty cuts of meat. Poultry with skin. Breaded or fried meat. Processed meats. Dairy Full-fat yogurt, cheese, or milk. Beverages Sweetened drinks, such as iced tea and soda. Fats and oils Butter. Lard. Ghee. Sweets and desserts Baked goods, such as cake, cupcakes, pastries, cookies, and cheesecake. Seasonings and condiments Spice mixes with added salt. Ketchup. Barbecue sauce. Mayonnaise. The items listed above may not be a complete list of foods and beverages that are not recommended. Contact a dietitian for more information. Where to find more information American Diabetes Association: www.diabetes.org Summary You may need to make diet and lifestyle changes to help prevent the onset of diabetes. These changes can help you control blood sugar, improve cholesterol levels, and manage blood pressure. Set weight loss goals with help from your health care team. It is recommended that most people with prediabetes lose 7% of their body weight. Consider following a Mediterranean diet. This includes eating plenty of fresh fruits and vegetables, whole grains, beans, nuts, seeds, fish, and low-fat dairy, and using olive oil instead of other fats. This information is not intended to replace advice given to you by your health care provider. Make sure you discuss any questions you have with your health care provider. Document Revised: 03/26/2019 Document Reviewed: 03/26/2019 Elsevier Patient Education  2024 Elsevier Inc.  

## 2022-11-19 NOTE — Progress Notes (Unsigned)
Subjective:    Patient ID: Alexis Berger, female    DOB: [birth year], 52 y.o.   MRN: [id number]  HPI  Patient presents to clinic today for her annual exam.  Flu: never Tetanus: unsure Covid: never Shingrix: never Pap smear: hysterectomy Mammogram: 10/2021 Colon screening: never Vision screening: annually Dentist: biannually  Diet: She does eat meat. She consumes some fruits and veggies. She does eat fried foods. She drinks mostly sweet tea. Exercise: None  Review of Systems     Past Medical History:  Diagnosis Date   Anxiety    Dental root implant present    Depression    Frequent headaches    Hyperlipidemia    Hypertension     Current Outpatient Medications  Medication Sig Dispense Refill   ALPRAZolam (XANAX) 0.5 MG tablet TAKE 1 TABLET(0.5 MG) BY MOUTH DAILY AS NEEDED FOR ANXIETY 20 tablet 0   furosemide (LASIX) 20 MG tablet TAKE 1 TABLET(20 MG) BY MOUTH DAILY 30 tablet 0   ibuprofen (ADVIL,MOTRIN) 200 MG tablet Take 200 mg by mouth as needed.     Insulin Pen Needle (PEN NEEDLES) 30G X 5 MM MISC 1 application by Does not apply route daily. (Patient not taking: Reported on 10/27/2021) 30 each 0   lisinopril (ZESTRIL) 20 MG tablet TAKE 1 TABLET(20 MG) BY MOUTH DAILY 30 tablet 0   Na Sulfate-K Sulfate-Mg Sulf 17.5-3.13-1.6 GM/177ML SOLN At 5:00 pm the evening before the procedure: Pour the contents of one bottle of Suprep into the mixing container provided.  Fill the container with cold water to the fill line and mix. DRINK all the liquid in the container. You must drink two (2) more 16 ounces containers of water over the next 1 hour. On the day of the procedure, 5 hours before procedure: Pour the contents of the second bottle of Suprep into the mixing container provided and follow the same instructions. 354 mL 0   venlafaxine XR (EFFEXOR-XR) 37.5 MG 24 hr capsule TAKE 1 CAPSULE(37.5 MG) BY MOUTH DAILY WITH BREAKFAST 30 capsule 0   No current facility-administered  medications for this visit.    No Known Allergies  Family History  Problem Relation Age of Onset   Hypertension Mother    Dementia Father    Breast cancer Neg Hx    Colon cancer Neg Hx     Social History   Socioeconomic History   Marital status: Married    Spouse name: Aamyah Assi   Number of children: Not on file   Years of education: Not on file   Highest education level: Not on file  Occupational History   Not on file  Tobacco Use   Smoking status: Never   Smokeless tobacco: Never  Vaping Use   Vaping status: Never Used  Substance and Sexual Activity   Alcohol use: Not Currently   Drug use: Never   Sexual activity: Yes    Partners: Male  Other Topics Concern   Not on file  Social History Narrative   Not on file   Social Determinants of Health   Financial Resource Strain: Not on file  Food Insecurity: Not on file  Transportation Needs: Not on file  Physical Activity: Not on file  Stress: Not on file  Social Connections: Not on file  Intimate Partner Violence: Not on file     Constitutional: Denies fever, malaise, fatigue, headache or abrupt weight changes.  HEENT: Denies eye pain, eye redness, ear pain, ringing in the ears, wax buildup,  runny nose, nasal congestion, bloody nose, or sore throat. Respiratory: Denies difficulty breathing, shortness of breath, cough or sputum production.   Cardiovascular: Pt reports swelling in legs. Denies chest pain, chest tightness, palpitations or swelling in the hands.  Gastrointestinal: Denies abdominal pain, bloating, constipation, diarrhea or blood in the stool.  GU: Denies urgency, frequency, pain with urination, burning sensation, blood in urine, odor or discharge. Musculoskeletal: Denies decrease in range of motion, difficulty with gait, muscle pain or joint pain and swelling.  Skin: Denies redness, rashes, lesions or ulcercations.  Neurological: Denies dizziness, difficulty with memory, difficulty with speech or  problems with balance and coordination.  Psych: Patient has a history of anxiety and depression.  Denies SI/HI.  No other specific complaints in a complete review of systems (except as listed in HPI above).  Objective:   Physical Exam  BP 132/78   Ht 5\' 5"  (1.651 m)   Wt 276 lb (125.2 kg)   BMI 45.93 kg/m   Wt Readings from Last 3 Encounters:  10/26/21 276 lb (125.2 kg)  01/18/21 267 lb (121.1 kg)  08/05/20 253 lb 3.2 oz (114.9 kg)    General: Appears her stated age, obese, in NAD. Skin: Warm, dry and intact.  HEENT: Head: normal shape and size; Eyes: sclera white, no icterus, conjunctiva pink, PERRLA and EOMs intact;  Neck:  Neck supple, trachea midline. No masses, lumps or thyromegaly present.  Cardiovascular: Normal rate and rhythm. S1,S2 noted.  No murmur, rubs or gallops noted. No JVD. 1+ non pitting BLE edema. No carotid bruits noted. Pulmonary/Chest: Normal effort and positive vesicular breath sounds. No respiratory distress. No wheezes, rales or ronchi noted.  Abdomen: Soft and nontender. Normal bowel sounds.  Musculoskeletal: Strength 5/5 BUE/BLE. No difficulty with gait.  Neurological: Alert and oriented. Cranial nerves II-XII grossly intact. Coordination normal.  Psychiatric: Mood and affect normal. Behavior is normal. Judgment and thought content normal.     BMET    Component Value Date/Time   NA 137 11/16/2021 1608   K 3.8 11/16/2021 1608   CL 100 11/16/2021 1608   CO2 27 11/16/2021 1608   GLUCOSE 126 (H) 11/16/2021 1608   BUN 11 11/16/2021 1608   CREATININE 0.72 11/16/2021 1608   CALCIUM 9.3 11/16/2021 1608    Lipid Panel     Component Value Date/Time   CHOL 186 10/26/2021 1558   TRIG 229 (H) 10/26/2021 1558   HDL 51 10/26/2021 1558   CHOLHDL 3.6 10/26/2021 1558   VLDL 67.0 (H) 10/28/2018 1555   LDLCALC 100 (H) 10/26/2021 1558    CBC    Component Value Date/Time   WBC 8.2 10/26/2021 1558   RBC 5.12 (H) 10/26/2021 1558   HGB 14.8 10/26/2021  1558   HGB 8.3 (L) 03/26/2011 1155   HCT 44.0 10/26/2021 1558   HCT 31.5 (L) 03/27/2011 0559   PLT 262 10/26/2021 1558   MCV 85.9 10/26/2021 1558   MCH 28.9 10/26/2021 1558   MCHC 33.6 10/26/2021 1558   RDW 13.0 10/26/2021 1558    Hgb A1C Lab Results  Component Value Date   HGBA1C 6.0 (H) 10/26/2021           Assessment & Plan:   Preventative health maintenance:  She declines flu shot today Tdap today Encouraged her to get her COVID vaccine Discussed Shingrix vaccine, she will check coverage with her insurance, and schedule visit if she would like to have this done She no longer needs to screen for cervical cancer  Mammogram has been scheduled Cologuard ordered Encouraged her to try to consume a balanced diet and exercise regimen Advised her to see an eye doctor and dentist annually Will check CBC, c-Met, lipid, A1c today  RTC in 6 months, follow-up chronic conditions Nicki Reaper, NP   RTC in 6 months for followup chronic conditions Nicki Reaper, NP

## 2022-11-19 NOTE — Assessment & Plan Note (Signed)
Will trial Contrave, samples provided

## 2022-11-20 ENCOUNTER — Telehealth: Payer: Self-pay

## 2022-11-20 LAB — COMPLETE METABOLIC PANEL WITH GFR
AG Ratio: 1.3 (calc) (ref 1.0–2.5)
ALT: 28 U/L (ref 6–29)
AST: 24 U/L (ref 10–35)
Albumin: 4.1 g/dL (ref 3.6–5.1)
Alkaline phosphatase (APISO): 120 U/L (ref 37–153)
BUN: 9 mg/dL (ref 7–25)
CO2: 30 mmol/L (ref 20–32)
Calcium: 9.3 mg/dL (ref 8.6–10.4)
Chloride: 100 mmol/L (ref 98–110)
Creat: 0.89 mg/dL (ref 0.50–1.03)
Globulin: 3.1 g/dL (ref 1.9–3.7)
Glucose, Bld: 109 mg/dL (ref 65–139)
Potassium: 4.1 mmol/L (ref 3.5–5.3)
Sodium: 139 mmol/L (ref 135–146)
Total Bilirubin: 0.4 mg/dL (ref 0.2–1.2)
Total Protein: 7.2 g/dL (ref 6.1–8.1)
eGFR: 77 mL/min/{1.73_m2} (ref >=60)

## 2022-11-20 LAB — CBC
HCT: 44.3 % (ref 35.0–45.0)
Hemoglobin: 14.8 g/dL (ref 11.7–15.5)
MCH: 29.4 pg (ref 27.0–33.0)
MCHC: 33.4 g/dL (ref 32.0–36.0)
MCV: 88.1 fL (ref 80.0–100.0)
MPV: 11 fL (ref 7.5–12.5)
Platelets: 273 10*3/uL (ref 140–400)
RBC: 5.03 10*6/uL (ref 3.80–5.10)
RDW: 12.9 % (ref 11.0–15.0)
WBC: 7.5 10*3/uL (ref 3.8–10.8)

## 2022-11-20 LAB — LIPID PANEL
Cholesterol: 193 mg/dL (ref <200)
HDL: 48 mg/dL — ABNORMAL LOW (ref >=50)
LDL Cholesterol (Calc): 110 mg/dL — ABNORMAL HIGH
Non-HDL Cholesterol (Calc): 145 mg/dL — ABNORMAL HIGH (ref <130)
Total CHOL/HDL Ratio: 4 (calc) (ref <5.0)
Triglycerides: 232 mg/dL — ABNORMAL HIGH (ref <150)

## 2022-11-20 LAB — HEMOGLOBIN A1C
Hgb A1c MFr Bld: 6.2 %{Hb} — ABNORMAL HIGH (ref <5.7)
Mean Plasma Glucose: 131 mg/dL
eAG (mmol/L): 7.3 mmol/L

## 2022-11-20 NOTE — Telephone Encounter (Signed)
Patient called in saying Ozempic, Monunjaro, and Trulicity are options with her insurance.

## 2022-11-21 NOTE — Telephone Encounter (Signed)
This will only be covered if she has diabetes, not indicated for weight loss management.

## 2022-11-22 NOTE — Telephone Encounter (Signed)
She should be taking 2 20 mg tablets daily for a total of 40 mg

## 2022-11-23 MED ORDER — FUROSEMIDE 20 MG PO TABS: 40.0000 mg | ORAL_TABLET | Freq: Every day | ORAL | 1 refills | Status: DC

## 2022-12-03 ENCOUNTER — Ambulatory Visit
Admission: RE | Admit: 2022-12-03 | Discharge: 2022-12-03 | Disposition: A | Discharge status: Home / Self Care | Payer: 59 | Source: Ambulatory Visit | Attending: Internal Medicine | Admitting: Internal Medicine

## 2022-12-03 DIAGNOSIS — Z1231 Encounter for screening mammogram for malignant neoplasm of breast: Secondary | ICD-10-CM | POA: Diagnosis present

## 2022-12-09 ENCOUNTER — Other Ambulatory Visit: Payer: Self-pay | Admitting: Internal Medicine

## 2022-12-10 NOTE — Telephone Encounter (Signed)
Requested medication (s) are due for refill today - yes  Requested medication (s) are on the active medication list -yes  Future visit scheduled -yes  Last refill: 10/28/22 #20  Notes to clinic: non delegated Rx  Requested Prescriptions  Pending Prescriptions Disp Refills   ALPRAZolam (XANAX) 0.5 MG tablet [Pharmacy Med Name: ALPRAZOLAM 0.5MG  TABLETS] 20 tablet     Sig: TAKE 1 TABLET(0.5 MG) BY MOUTH DAILY AS NEEDED FOR ANXIETY     Not Delegated - Psychiatry: Anxiolytics/Hypnotics 2 Failed - 12/09/2022  2:03 PM      Failed - This refill cannot be delegated      Failed - Urine Drug Screen completed in last 360 days      Passed - Patient is not pregnant      Passed - Valid encounter within last 6 months    Recent Outpatient Visits           3 weeks ago Encounter for general adult medical examination with abnormal findings   [place] [facility] [place], Salvadore Oxford, NP   1 year ago Encounter for general adult medical examination with abnormal findings   [place] [facility] [place], Salvadore Oxford, NP   1 year ago Primary hypertension   [place] [facility] [place], Salvadore Oxford, NP   2 years ago Other fatigue   [facility] [place], Salvadore Oxford, NP       Future Appointments             In 5 months Baity, Salvadore Oxford, NP [place] [facility], [facility]               Requested Prescriptions  Pending Prescriptions Disp Refills   ALPRAZolam Prudy Feeler) 0.5 MG tablet [Pharmacy Med Name: ALPRAZOLAM 0.5MG  TABLETS] 20 tablet     Sig: TAKE 1 TABLET(0.5 MG) BY MOUTH DAILY AS NEEDED FOR ANXIETY     Not Delegated - Psychiatry: Anxiolytics/Hypnotics 2 Failed - 12/09/2022  2:03 PM      Failed - This refill cannot be delegated      Failed - Urine Drug Screen completed in last 360 days      Passed - Patient is not pregnant      Passed - Valid encounter within last 6 months    Recent Outpatient Visits            3 weeks ago Encounter for general adult medical examination with abnormal findings   [place] [facility] [place], Salvadore Oxford, NP   1 year ago Encounter for general adult medical examination with abnormal findings   [place] [facility] [place], Salvadore Oxford, NP   1 year ago Primary hypertension   [place] [facility] [place], Salvadore Oxford, NP   2 years ago Other fatigue   [place] [facility] [place], Salvadore Oxford, NP       Future Appointments             In 5 months Baity, Salvadore Oxford, NP  [facility], [facility]

## 2023-02-04 ENCOUNTER — Telehealth: Payer: 59 | Admitting: Emergency Medicine

## 2023-02-04 DIAGNOSIS — J019 Acute sinusitis, unspecified: Secondary | ICD-10-CM

## 2023-02-04 DIAGNOSIS — B9689 Other specified bacterial agents as the cause of diseases classified elsewhere: Secondary | ICD-10-CM

## 2023-02-04 MED ORDER — DOXYCYCLINE HYCLATE 100 MG PO TABS
100.0000 mg | ORAL_TABLET | Freq: Two times a day (BID) | ORAL | 0 refills | Status: AC
Start: 2023-02-04 — End: 2023-02-14

## 2023-02-04 MED ORDER — BENZONATATE 100 MG PO CAPS: 100.0000 mg | ORAL_CAPSULE | Freq: Two times a day (BID) | ORAL | 0 refills | Status: DC | PRN

## 2023-02-04 NOTE — Progress Notes (Signed)
E-Visit for Sinus Problems  We are sorry that you are not feeling well.  Here is how we plan to help!  Based on what you have shared with me it looks like you have sinusitis.  Sinusitis is inflammation and infection in the sinus cavities of the head.  Based on your presentation I believe you most likely have Acute Bacterial Sinusitis.  This is an infection caused by bacteria and is treated with antibiotics. I have prescribed Doxycycline 100mg  by mouth twice a day for 10 days.   You may use an oral decongestant such as Mucinex D or if you have glaucoma or high blood pressure use plain Mucinex.   Saline nasal spray help and can safely be used as often as needed for congestion.  Try using saline irrigation, such as with a neti pot, several times a day while you are sick. Many neti pots come with salt packets premeasured to use to make saline. If you use your own salt, make sure it is kosher salt or sea salt (don't use table salt as it has iodine in it and you don't need that in your nose). Use distilled water to make saline. If you mix your own saline using your own salt, the recipe is 1/4 teaspoon salt in 1 cup warm water. Using saline irrigation can help prevent and treat sinus infections.   If you develop worsening sinus pain, fever or notice severe headache and vision changes, or if symptoms are not better after completion of antibiotic, please schedule an appointment with a health care provider.    Sinus infections are not as easily transmitted as other respiratory infection, however we still recommend that you avoid close contact with loved ones, especially the very young and elderly.  Remember to wash your hands thoroughly throughout the day as this is the number one way to prevent the spread of infection!  Home Care: Only take medications as instructed by your medical team. Complete the entire course of an antibiotic. Do not take these medications with alcohol. A steam or ultrasonic humidifier  can help congestion.  You can place a towel over your head and breathe in the steam from hot water coming from a faucet. Avoid close contacts especially the very young and the elderly. Cover your mouth when you cough or sneeze. Always remember to wash your hands.  Get Help Right Away If: You develop worsening fever or sinus pain. You develop a severe head ache or visual changes. Your symptoms persist after you have completed your treatment plan.  Make sure you Understand these instructions. Will watch your condition. Will get help right away if you are not doing well or get worse.  Thank you for choosing an e-visit.  Your e-visit answers were reviewed by a board certified advanced clinical practitioner to complete your personal care plan. Depending upon the condition, your plan could have included both over the counter or prescription medications.  Please review your pharmacy choice. Make sure the pharmacy is open so you can pick up prescription now. If there is a problem, you may contact your provider through [employer] and have the prescription routed to another pharmacy.  Your safety is important to [place]. If you have drug allergies check your prescription carefully.   For the next 24 hours you can use MyChart to ask questions about today's visit, request a non-urgent call back, or ask for a work or school excuse. You will get an email in the next two days asking about  your experience. I hope that your e-visit has been valuable and will speed your recovery.  I have spent 5 minutes in review of e-visit questionnaire, review and updating patient chart, medical decision making and response to patient.   Rica Mast, PhD, FNP-BC

## 2023-05-20 ENCOUNTER — Ambulatory Visit: Payer: Self-pay | Admitting: Internal Medicine

## 2023-05-27 ENCOUNTER — Encounter: Payer: Self-pay | Admitting: Internal Medicine

## 2023-05-27 ENCOUNTER — Ambulatory Visit: Payer: Self-pay | Admitting: Internal Medicine

## 2023-05-27 VITALS — BP 134/82 | Ht 65.0 in | Wt 273.0 lb

## 2023-05-27 DIAGNOSIS — F419 Anxiety disorder, unspecified: Secondary | ICD-10-CM

## 2023-05-27 DIAGNOSIS — E7800 Pure hypercholesterolemia, unspecified: Secondary | ICD-10-CM | POA: Diagnosis not present

## 2023-05-27 DIAGNOSIS — I10 Essential (primary) hypertension: Secondary | ICD-10-CM

## 2023-05-27 DIAGNOSIS — R7303 Prediabetes: Secondary | ICD-10-CM

## 2023-05-27 DIAGNOSIS — Z6841 Body Mass Index (BMI) 40.0 and over, adult: Secondary | ICD-10-CM

## 2023-05-27 DIAGNOSIS — F32A Depression, unspecified: Secondary | ICD-10-CM

## 2023-05-27 DIAGNOSIS — E66813 Obesity, class 3: Secondary | ICD-10-CM

## 2023-05-27 NOTE — Patient Instructions (Signed)

## 2023-05-27 NOTE — Assessment & Plan Note (Signed)
 Continue lisinopril  and furosemide  Reinforced DASH diet and exercise for weight loss CMET today

## 2023-05-27 NOTE — Assessment & Plan Note (Signed)
A1c today Encourage low-carb diet and exercise weight loss 

## 2023-05-27 NOTE — Assessment & Plan Note (Signed)
 Persistent Continue sertraline  and xanax  Support offered

## 2023-05-27 NOTE — Progress Notes (Signed)
 Subjective:    Patient ID: Alexis Berger, female    DOB: Mar 14, 1970, 53 y.o.   MRN: [id number]  HPI  Patient presents to clinic today for 46-month follow-up of chronic conditions.  HTN: Her BP today is 134/82.  She is taking lisinopril  and furosemide  as prescribed.  There is no ECG on file.  Anxiety and depression: Chronic, managed on venlafaxine  and alprazolam .  She is not currently seeing a therapist.  She denies SI/HI.  HLD: Her last LDL was 110, triglycerides 232, 11/2022.  She is not taking any cholesterol-lowering medications time.  She has been trying to consume a low-fat diet.  Prediabetes: Her last A1c was 6.2%, 11/2022.  She is not taking any oral diabetic medication at this time.  She does not check her sugars.  Review of Systems   Past Medical History:  Diagnosis Date   Anxiety    Dental root implant present    Depression    Frequent headaches    Hyperlipidemia    Hypertension     Current Outpatient Medications  Medication Sig Dispense Refill   ALPRAZolam  (XANAX ) 0.5 MG tablet TAKE 1 TABLET(0.5 MG) BY MOUTH DAILY AS NEEDED FOR ANXIETY 20 tablet 0   benzonatate  (TESSALON ) 100 MG capsule Take 1 capsule (100 mg total) by mouth 2 (two) times daily as needed for cough. 20 capsule 0   furosemide  (LASIX ) 20 MG tablet Take 2 tablets (40 mg total) by mouth daily. 180 tablet 1   ibuprofen (ADVIL,MOTRIN) 200 MG tablet Take 200 mg by mouth as needed.     Insulin Pen Needle (PEN NEEDLES) 30G X 5 MM MISC 1 application by Does not apply route daily. 30 each 0   lisinopril  (ZESTRIL ) 20 MG tablet Take 1 tablet (20 mg total) by mouth daily. 90 tablet 1   Na Sulfate-K Sulfate-Mg Sulf 17.5-3.13-1.6 GM/177ML SOLN At 5:00 pm the evening before the procedure: Pour the contents of one bottle of Suprep into the mixing container provided.  Fill the container with cold water to the fill line and mix. DRINK all the liquid in the container. You must drink two (2) more 16 ounces containers of  water over the next 1 hour. On the day of the procedure, 5 hours before procedure: Pour the contents of the second bottle of Suprep into the mixing container provided and follow the same instructions. 354 mL 0   venlafaxine  XR (EFFEXOR -XR) 37.5 MG 24 hr capsule Take 1 capsule (37.5 mg total) by mouth daily with breakfast. 90 capsule 1   No current facility-administered medications for this visit.    No Known Allergies  Family History  Problem Relation Age of Onset   Hypertension Mother    Dementia Father    Breast cancer Neg Hx    Colon cancer Neg Hx     Social History   Socioeconomic History   Marital status: Married    Spouse name: Angelyn Osterberg   Number of children: Not on file   Years of education: Not on file   Highest education level: Not on file  Occupational History   Not on file  Tobacco Use   Smoking status: Never   Smokeless tobacco: Never  Vaping Use   Vaping status: Never Used  Substance and Sexual Activity   Alcohol use: Not Currently   Drug use: Never   Sexual activity: Yes    Partners: Male  Other Topics Concern   Not on file  Social History Narrative   Not  on file   Social Drivers of Health   Financial Resource Strain: Not on file  Food Insecurity: Not on file  Transportation Needs: Not on file  Physical Activity: Not on file  Stress: Not on file  Social Connections: Not on file  Intimate Partner Violence: Not on file     Constitutional: Denies fever, malaise, fatigue, headache or abrupt weight changes.  HEENT: Denies eye pain, eye redness, ear pain, ringing in the ears, wax buildup, runny nose, nasal congestion, bloody nose, or sore throat. Respiratory: Denies difficulty breathing, shortness of breath, cough or sputum production.   Cardiovascular: Denies chest pain, chest tightness, palpitations or swelling in the hands or feet.  Gastrointestinal: Denies abdominal pain, bloating, constipation, diarrhea or blood in the stool.  GU: Denies  urgency, frequency, pain with urination, burning sensation, blood in urine, odor or discharge. Musculoskeletal: Denies decrease in range of motion, difficulty with gait, muscle pain or joint pain and swelling.  Skin: Denies redness, rashes, lesions or ulcercations.  Neurological: Denies dizziness, difficulty with memory, difficulty with speech or problems with balance and coordination.  Psych: Patient has a history of anxiety and depression.  Denies SI/HI.  No other specific complaints in a complete review of systems (except as listed in HPI above).      Objective:   Physical Exam  BP 134/82 (BP Location: Left Arm, Patient Position: Sitting, Cuff Size: Large)   Ht 5\' 5"  (1.651 m)   Wt 273 lb (123.8 kg)   BMI 45.43 kg/m   Wt Readings from Last 3 Encounters:  11/19/22 276 lb (125.2 kg)  10/26/21 276 lb (125.2 kg)  01/18/21 267 lb (121.1 kg)    General: Appears her stated age, obese, in NAD. Skin: Warm, dry and intact.  HEENT: Head: normal shape and size; Eyes: sclera white, no icterus, conjunctiva pink, PERRLA and EOMs intact;  Cardiovascular: Normal rate and rhythm. S1,S2 noted.  No murmur, rubs or gallops noted. No JVD. Trace nonpitting BLE edema. No carotid bruits noted. Pulmonary/Chest: Normal effort and positive vesicular breath sounds. No respiratory distress. No wheezes, rales or ronchi noted.  Abdomen: Soft and nontender. Normal bowel sounds. No distention or masses noted. Liver, spleen and kidneys non palpable. Musculoskeletal: No difficulty with gait.  Neurological: Alert and oriented. Coordination normal.  Psychiatric: Mood and affect normal. Behavior is normal. Judgment and thought content normal.    BMET    Component Value Date/Time   NA 139 11/19/2022 1552   K 4.1 11/19/2022 1552   CL 100 11/19/2022 1552   CO2 30 11/19/2022 1552   GLUCOSE 109 11/19/2022 1552   BUN 9 11/19/2022 1552   CREATININE 0.89 11/19/2022 1552   CALCIUM 9.3 11/19/2022 1552    Lipid  Panel     Component Value Date/Time   CHOL 193 11/19/2022 1552   TRIG 232 (H) 11/19/2022 1552   HDL 48 (L) 11/19/2022 1552   CHOLHDL 4.0 11/19/2022 1552   VLDL 67.0 (H) 10/28/2018 1555   LDLCALC 110 (H) 11/19/2022 1552    CBC    Component Value Date/Time   WBC 7.5 11/19/2022 1552   RBC 5.03 11/19/2022 1552   HGB 14.8 11/19/2022 1552   HGB 8.3 (L) 03/26/2011 1155   HCT 44.3 11/19/2022 1552   HCT 31.5 (L) 03/27/2011 0559   PLT 273 11/19/2022 1552   MCV 88.1 11/19/2022 1552   MCH 29.4 11/19/2022 1552   MCHC 33.4 11/19/2022 1552   RDW 12.9 11/19/2022 1552    Hgb  A1C Lab Results  Component Value Date   HGBA1C 6.2 (H) 11/19/2022            Assessment & Plan:    RTC in 6 months for your annual exam Helayne Lo, NP

## 2023-05-27 NOTE — Assessment & Plan Note (Signed)
 C-Met and lipid profile today Encouraged her to consume a low-fat diet

## 2023-05-27 NOTE — Assessment & Plan Note (Signed)
 Encouraged diet and exercise for weight loss ?

## 2023-05-28 ENCOUNTER — Ambulatory Visit: Payer: Self-pay | Admitting: Internal Medicine

## 2023-05-28 LAB — COMPREHENSIVE METABOLIC PANEL WITH GFR
AG Ratio: 1.5 (calc) (ref 1.0–2.5)
ALT: 20 U/L (ref 6–29)
AST: 19 U/L (ref 10–35)
Albumin: 4.2 g/dL (ref 3.6–5.1)
Alkaline phosphatase (APISO): 103 U/L (ref 37–153)
BUN: 12 mg/dL (ref 7–25)
CO2: 28 mmol/L (ref 20–32)
Calcium: 9.1 mg/dL (ref 8.6–10.4)
Chloride: 101 mmol/L (ref 98–110)
Creat: 0.81 mg/dL (ref 0.50–1.03)
Globulin: 2.8 g/dL (ref 1.9–3.7)
Glucose, Bld: 82 mg/dL (ref 65–139)
Potassium: 3.7 mmol/L (ref 3.5–5.3)
Sodium: 139 mmol/L (ref 135–146)
Total Bilirubin: 0.5 mg/dL (ref 0.2–1.2)
Total Protein: 7 g/dL (ref 6.1–8.1)
eGFR: 86 mL/min/{1.73_m2} (ref >=60)

## 2023-05-28 LAB — CBC
HCT: 41.8 % (ref 35.0–45.0)
Hemoglobin: 13.7 g/dL (ref 11.7–15.5)
MCH: 28.4 pg (ref 27.0–33.0)
MCHC: 32.8 g/dL (ref 32.0–36.0)
MCV: 86.5 fL (ref 80.0–100.0)
MPV: 10.6 fL (ref 7.5–12.5)
Platelets: 267 10*3/uL (ref 140–400)
RBC: 4.83 10*6/uL (ref 3.80–5.10)
RDW: 13 % (ref 11.0–15.0)
WBC: 8.7 10*3/uL (ref 3.8–10.8)

## 2023-05-28 LAB — HEMOGLOBIN A1C
Hgb A1c MFr Bld: 6.1 % — ABNORMAL HIGH
Mean Plasma Glucose: 128 mg/dL
eAG (mmol/L): 7.1 mmol/L

## 2023-05-28 LAB — LIPID PANEL
Cholesterol: 192 mg/dL
HDL: 46 mg/dL — ABNORMAL LOW
LDL Cholesterol (Calc): 114 mg/dL — ABNORMAL HIGH
Non-HDL Cholesterol (Calc): 146 mg/dL — ABNORMAL HIGH
Total CHOL/HDL Ratio: 4.2 (calc)
Triglycerides: 197 mg/dL — ABNORMAL HIGH

## 2023-05-29 MED ORDER — ATORVASTATIN CALCIUM 10 MG PO TABS: 10.0000 mg | ORAL_TABLET | Freq: Every day | ORAL | 1 refills | Status: AC

## 2023-07-21 ENCOUNTER — Other Ambulatory Visit: Payer: Self-pay | Admitting: Internal Medicine

## 2023-07-23 NOTE — Telephone Encounter (Signed)
 Requested Prescriptions  Pending Prescriptions Disp Refills   venlafaxine  XR (EFFEXOR -XR) 37.5 MG 24 hr capsule [Pharmacy Med Name: VENLAFAXINE  ER 37.5MG  CAPSULES] 90 capsule 0    Sig: TAKE 1 CAPSULE(37.5 MG) BY MOUTH DAILY WITH BREAKFAST     Psychiatry: Antidepressants - SNRI - desvenlafaxine & venlafaxine  Failed - 07/23/2023  2:13 PM      Failed - Lipid Panel in normal range within the last 12 months    Cholesterol  Date Value Ref Range Status  05/27/2023 192 <200 mg/dL Final   LDL Cholesterol (Calc)  Date Value Ref Range Status  05/27/2023 114 (H) mg/dL (calc) Final    Comment:    Reference range: <100 . Desirable range <100 mg/dL for primary prevention;   <70 mg/dL for patients with CHD or diabetic patients  with > or = 2 CHD risk factors. SABRA LDL-C is now calculated using the Martin-Hopkins  calculation, which is a validated novel method providing  better accuracy than the Friedewald equation in the  estimation of LDL-C.  Gladis APPLETHWAITE et al. SANDREA. 7986;689(80): 2061-2068  (http://education.[employer].com/faq/FAQ164)    Direct LDL  Date Value Ref Range Status  10/28/2018 133.0 mg/dL Final    Comment:    Optimal:  <100 mg/dLNear or Above Optimal:  100-129 mg/dLBorderline High:  130-159 mg/dLHigh:  160-189 mg/dLVery High:  >190 mg/dL   HDL  Date Value Ref Range Status  05/27/2023 46 (L) > OR = 50 mg/dL Final   Triglycerides  Date Value Ref Range Status  05/27/2023 197 (H) <150 mg/dL Final         Passed - Cr in normal range and within 360 days    Creat  Date Value Ref Range Status  05/27/2023 0.81 0.50 - 1.03 mg/dL Final         Passed - Completed PHQ-2 or PHQ-9 in the last 360 days      Passed - Last BP in normal range    BP Readings from Last 1 Encounters:  05/27/23 134/82         Passed - Valid encounter within last 6 months    Recent Outpatient Visits           1 month ago Pure hypercholesterolemia   [place] [facility] [place],  Angeline ORN, NP               lisinopril  (ZESTRIL ) 20 MG tablet [Pharmacy Med Name: LISINOPRIL  20MG  TABLETS] 90 tablet 0    Sig: TAKE 1 TABLET(20 MG) BY MOUTH DAILY     Cardiovascular:  ACE Inhibitors Passed - 07/23/2023  2:13 PM      Passed - Cr in normal range and within 180 days    Creat  Date Value Ref Range Status  05/27/2023 0.81 0.50 - 1.03 mg/dL Final         Passed - K in normal range and within 180 days    Potassium  Date Value Ref Range Status  05/27/2023 3.7 3.5 - 5.3 mmol/L Final         Passed - Patient is not pregnant      Passed - Last BP in normal range    BP Readings from Last 1 Encounters:  05/27/23 134/82         Passed - Valid encounter within last 6 months    Recent Outpatient Visits           1 month ago Pure hypercholesterolemia   [place] [facility]  [place] Angeline ORN, NP               furosemide  (LASIX ) 20 MG tablet [Pharmacy Med Name: FUROSEMIDE  20MG  TABLETS] 180 tablet 0    Sig: TAKE 2 TABLETS(40 MG) BY MOUTH DAILY     Cardiovascular:  Diuretics - Loop Failed - 07/23/2023  2:13 PM      Failed - Mg Level in normal range and within 180 days    No results found for: MG       Passed - K in normal range and within 180 days    Potassium  Date Value Ref Range Status  05/27/2023 3.7 3.5 - 5.3 mmol/L Final         Passed - Ca in normal range and within 180 days    Calcium   Date Value Ref Range Status  05/27/2023 9.1 8.6 - 10.4 mg/dL Final         Passed - Na in normal range and within 180 days    Sodium  Date Value Ref Range Status  05/27/2023 139 135 - 146 mmol/L Final         Passed - Cr in normal range and within 180 days    Creat  Date Value Ref Range Status  05/27/2023 0.81 0.50 - 1.03 mg/dL Final         Passed - Cl in normal range and within 180 days    Chloride  Date Value Ref Range Status  05/27/2023 101 98 - 110 mmol/L Final         Passed - Last BP in normal range    BP Readings from Last 1  Encounters:  05/27/23 134/82         Passed - Valid encounter within last 6 months    Recent Outpatient Visits           1 month ago Pure hypercholesterolemia   [place] [facility] [place], Angeline ORN, NP

## 2023-07-26 ENCOUNTER — Other Ambulatory Visit: Payer: Self-pay | Admitting: Internal Medicine

## 2023-07-29 NOTE — Telephone Encounter (Signed)
 Requested medication (s) are due for refill today - yes  Requested medication (s) are on the active medication list -yes  Future visit scheduled -yes  Last refill: 12/10/22 #20  Notes to clinic: non delegated Rx  Requested Prescriptions  Pending Prescriptions Disp Refills   ALPRAZolam  (XANAX ) 0.5 MG tablet [Pharmacy Med Name: ALPRAZOLAM  0.5MG  TABLETS] 20 tablet     Sig: TAKE 1 TABLET(0.5 MG) BY MOUTH DAILY AS NEEDED FOR ANXIETY     Not Delegated - Psychiatry: Anxiolytics/Hypnotics 2 Failed - 07/29/2023  4:04 PM      Failed - This refill cannot be delegated      Failed - Urine Drug Screen completed in last 360 days      Passed - Patient is not pregnant      Passed - Valid encounter within last 6 months    Recent Outpatient Visits           2 months ago Pure hypercholesterolemia   [facility] [place], Angeline ORN, NP                 Requested Prescriptions  Pending Prescriptions Disp Refills   ALPRAZolam  (XANAX ) 0.5 MG tablet [Pharmacy Med Name: ALPRAZOLAM  0.5MG  TABLETS] 20 tablet     Sig: TAKE 1 TABLET(0.5 MG) BY MOUTH DAILY AS NEEDED FOR ANXIETY     Not Delegated - Psychiatry: Anxiolytics/Hypnotics 2 Failed - 07/29/2023  4:04 PM      Failed - This refill cannot be delegated      Failed - Urine Drug Screen completed in last 360 days      Passed - Patient is not pregnant      Passed - Valid encounter within last 6 months    Recent Outpatient Visits           2 months ago Pure hypercholesterolemia   [place] [facility] [place], Angeline ORN, [place]

## 2023-11-22 ENCOUNTER — Encounter: Payer: Self-pay | Admitting: Internal Medicine

## 2023-11-22 ENCOUNTER — Ambulatory Visit (INDEPENDENT_AMBULATORY_CARE_PROVIDER_SITE_OTHER): Admitting: Internal Medicine

## 2023-11-22 VITALS — BP 132/82 | Ht 65.0 in | Wt 271.6 lb

## 2023-11-22 DIAGNOSIS — E7800 Pure hypercholesterolemia, unspecified: Secondary | ICD-10-CM

## 2023-11-22 DIAGNOSIS — R7303 Prediabetes: Secondary | ICD-10-CM | POA: Diagnosis not present

## 2023-11-22 DIAGNOSIS — Z0001 Encounter for general adult medical examination with abnormal findings: Secondary | ICD-10-CM

## 2023-11-22 DIAGNOSIS — Z1231 Encounter for screening mammogram for malignant neoplasm of breast: Secondary | ICD-10-CM

## 2023-11-22 DIAGNOSIS — Z6841 Body Mass Index (BMI) 40.0 and over, adult: Secondary | ICD-10-CM

## 2023-11-22 DIAGNOSIS — E66813 Obesity, class 3: Secondary | ICD-10-CM

## 2023-11-22 NOTE — Progress Notes (Signed)
 Subjective:    Patient ID: Alexis Berger, female    DOB: 20-Jun-1970, 53 y.o.   MRN: [id number]  HPI  Patient presents to clinic today for her annual exam.  Flu: never Tetanus: 11/2022 Covid: never Shingrix: never Pap smear: hysterectomy Mammogram: 11/2022 Colon screening: never Vision screening: annually Dentist: biannually  Diet: She does eat meat. She consumes some fruits and veggies. She does eat fried foods. She drinks mostly sweet tea. Exercise: None  Review of Systems     Past Medical History:  Diagnosis Date   Anxiety    Dental root implant present    Depression    Frequent headaches    Hyperlipidemia    Hypertension     Current Outpatient Medications  Medication Sig Dispense Refill   ALPRAZolam  (XANAX ) 0.5 MG tablet TAKE 1 TABLET(0.5 MG) BY MOUTH DAILY AS NEEDED FOR ANXIETY 20 tablet 0   atorvastatin  (LIPITOR) 10 MG tablet Take 1 tablet (10 mg total) by mouth daily. 90 tablet 1   furosemide  (LASIX ) 20 MG tablet TAKE 2 TABLETS(40 MG) BY MOUTH DAILY 180 tablet 0   ibuprofen (ADVIL,MOTRIN) 200 MG tablet Take 200 mg by mouth as needed.     lisinopril  (ZESTRIL ) 20 MG tablet TAKE 1 TABLET(20 MG) BY MOUTH DAILY 90 tablet 0   Na Sulfate-K Sulfate-Mg Sulf 17.5-3.13-1.6 GM/177ML SOLN At 5:00 pm the evening before the procedure: Pour the contents of one bottle of Suprep into the mixing container provided.  Fill the container with cold water to the fill line and mix. DRINK all the liquid in the container. You must drink two (2) more 16 ounces containers of water over the next 1 hour. On the day of the procedure, 5 hours before procedure: Pour the contents of the second bottle of Suprep into the mixing container provided and follow the same instructions. 354 mL 0   venlafaxine  XR (EFFEXOR -XR) 37.5 MG 24 hr capsule TAKE 1 CAPSULE(37.5 MG) BY MOUTH DAILY WITH BREAKFAST 90 capsule 0   No current facility-administered medications for this visit.    No Known Allergies  Family  History  Problem Relation Age of Onset   Hypertension Mother    Dementia Father    Breast cancer Neg Hx    Colon cancer Neg Hx     Social History   Socioeconomic History   Marital status: Married    Spouse name: Riham Polyakov   Number of children: Not on file   Years of education: Not on file   Highest education level: Not on file  Occupational History   Not on file  Tobacco Use   Smoking status: Never   Smokeless tobacco: Never  Vaping Use   Vaping status: Never Used  Substance and Sexual Activity   Alcohol use: Not Currently   Drug use: Never   Sexual activity: Yes    Partners: Male  Other Topics Concern   Not on file  Social History Narrative   Not on file   Social Drivers of Health   Financial Resource Strain: Not on file  Food Insecurity: Not on file  Transportation Needs: Not on file  Physical Activity: Not on file  Stress: Not on file  Social Connections: Not on file  Intimate Partner Violence: Not on file     Constitutional: Denies fever, malaise, fatigue, headache or abrupt weight changes.  HEENT: Denies eye pain, eye redness, ear pain, ringing in the ears, wax buildup, runny nose, nasal congestion, bloody nose, or sore throat. Respiratory:  Denies difficulty breathing, shortness of breath, cough or sputum production.   Cardiovascular: Pt reports swelling in legs. Denies chest pain, chest tightness, palpitations or swelling in the hands.  Gastrointestinal: Denies abdominal pain, bloating, constipation, diarrhea or blood in the stool.  GU: Denies urgency, frequency, pain with urination, burning sensation, blood in urine, odor or discharge. Musculoskeletal: Denies decrease in range of motion, difficulty with gait, muscle pain or joint pain and swelling.  Skin: Denies redness, rashes, lesions or ulcercations.  Neurological: Denies dizziness, difficulty with memory, difficulty with speech or problems with balance and coordination.  Psych: Patient has a history  of anxiety and depression.  Denies SI/HI.  No other specific complaints in a complete review of systems (except as listed in HPI above).  Objective:   Physical Exam  BP 132/82 (BP Location: Right Arm, Patient Position: Sitting, Cuff Size: Large)   Ht 5' 5 (1.651 m)   Wt 271 lb 9.6 oz (123.2 kg)   BMI 45.20 kg/m    Wt Readings from Last 3 Encounters:  05/27/23 273 lb (123.8 kg)  11/19/22 276 lb (125.2 kg)  10/26/21 276 lb (125.2 kg)    General: Appears her stated age, obese, in NAD. Skin: Warm, dry and intact.  HEENT: Head: normal shape and size; Eyes: sclera white, no icterus, conjunctiva pink, PERRLA and EOMs intact;  Neck:  Neck supple, trachea midline. No masses, lumps or thyromegaly present.  Cardiovascular: Normal rate and rhythm. S1,S2 noted.  No murmur, rubs or gallops noted. No JVD. 1+ non pitting BLE edema. No carotid bruits noted. Pulmonary/Chest: Normal effort and positive vesicular breath sounds. No respiratory distress. No wheezes, rales or ronchi noted.  Abdomen: Soft and nontender. Normal bowel sounds.  Musculoskeletal: Strength 5/5 BUE/BLE. No difficulty with gait.  Neurological: Alert and oriented. Cranial nerves II-XII grossly intact. Coordination normal.  Psychiatric: Mood and affect normal. Behavior is normal. Judgment and thought content normal.     BMET    Component Value Date/Time   NA 139 05/27/2023 1610   K 3.7 05/27/2023 1610   CL 101 05/27/2023 1610   CO2 28 05/27/2023 1610   GLUCOSE 82 05/27/2023 1610   BUN 12 05/27/2023 1610   CREATININE 0.81 05/27/2023 1610   CALCIUM  9.1 05/27/2023 1610    Lipid Panel     Component Value Date/Time   CHOL 192 05/27/2023 1610   TRIG 197 (H) 05/27/2023 1610   HDL 46 (L) 05/27/2023 1610   CHOLHDL 4.2 05/27/2023 1610   VLDL 67.0 (H) 10/28/2018 1555   LDLCALC 114 (H) 05/27/2023 1610    CBC    Component Value Date/Time   WBC 8.7 05/27/2023 1610   RBC 4.83 05/27/2023 1610   HGB 13.7 05/27/2023 1610    HGB 8.3 (L) 03/26/2011 1155   HCT 41.8 05/27/2023 1610   HCT 31.5 (L) 03/27/2011 0559   PLT 267 05/27/2023 1610   MCV 86.5 05/27/2023 1610   MCH 28.4 05/27/2023 1610   MCHC 32.8 05/27/2023 1610   RDW 13.0 05/27/2023 1610    Hgb A1C Lab Results  Component Value Date   HGBA1C 6.1 (H) 05/27/2023           Assessment & Plan:   Preventative health maintenance:  She declines flu shot today Tetanus UTD Encouraged her to get her COVID vaccine Discussed Shingrix vaccine, she will check coverage with her insurance, and schedule visit if she would like to have this done She no longer needs to screen for cervical cancer  Mammogram ordered-she will call to schedule She declines referral to GI for screening colonoscopy or Cologuard at this time but will consider this Encouraged her to try to consume a balanced diet and exercise regimen Advised her to see an eye doctor and dentist annually Will check CBC, c-Met, lipid, A1c today  RTC in 6 months, follow-up chronic conditions Angeline Laura, NP

## 2023-11-22 NOTE — Assessment & Plan Note (Signed)
 Encouraged diet and exercise for weight loss ?

## 2023-11-22 NOTE — Patient Instructions (Signed)
 Health Maintenance for Postmenopausal Women Menopause is a normal process in which your ability to get pregnant comes to an end. This process happens slowly over many months or years, usually between the ages of 76 and 38. Menopause is complete when you have missed your menstrual period for 12 months. It is important to talk with your health care provider about some of the most common conditions that affect women after menopause (postmenopausal women). These include heart disease, cancer, and bone loss (osteoporosis). Adopting a healthy lifestyle and getting preventive care can help to promote your health and wellness. The actions you take can also lower your chances of developing some of these common conditions. What are the signs and symptoms of menopause? During menopause, you may have the following symptoms: Hot flashes. These can be moderate or severe. Night sweats. Decrease in sex drive. Mood swings. Headaches. Tiredness (fatigue). Irritability. Memory problems. Problems falling asleep or staying asleep. Talk with your health care provider about treatment options for your symptoms. Do I need hormone replacement therapy? Hormone replacement therapy is effective in treating symptoms that are caused by menopause, such as hot flashes and night sweats. Hormone replacement carries certain risks, especially as you become older. If you are thinking about using estrogen or estrogen with progestin, discuss the benefits and risks with your health care provider. How can I reduce my risk for heart disease and stroke? The risk of heart disease, heart attack, and stroke increases as you age. One of the causes may be a change in the body's hormones during menopause. This can affect how your body uses dietary fats, triglycerides, and cholesterol. Heart attack and stroke are medical emergencies. There are many things that you can do to help prevent heart disease and stroke. Watch your blood pressure High  blood pressure causes heart disease and increases the risk of stroke. This is more likely to develop in people who have high blood pressure readings or are overweight. Have your blood pressure checked: Every 3-5 years if you are 32-23 years of age. Every year if you are 31 years old or older. Eat a healthy diet  Eat a diet that includes plenty of vegetables, fruits, low-fat dairy products, and lean protein. Do not eat a lot of foods that are high in solid fats, added sugars, or sodium. Get regular exercise Get regular exercise. This is one of the most important things you can do for your health. Most adults should: Try to exercise for at least 150 minutes each week. The exercise should increase your heart rate and make you sweat (moderate-intensity exercise). Try to do strengthening exercises at least twice each week. Do these in addition to the moderate-intensity exercise. Spend less time sitting. Even light physical activity can be beneficial. Other tips Work with your health care provider to achieve or maintain a healthy weight. Do not use any products that contain nicotine or tobacco. These products include cigarettes, chewing tobacco, and vaping devices, such as e-cigarettes. If you need help quitting, ask your health care provider. Know your numbers. Ask your health care provider to check your cholesterol and your blood sugar (glucose). Continue to have your blood tested as directed by your health care provider. Do I need screening for cancer? Depending on your health history and family history, you may need to have cancer screenings at different stages of your life. This may include screening for: Breast cancer. Cervical cancer. Lung cancer. Colorectal cancer. What is my risk for osteoporosis? After menopause, you may be  at increased risk for osteoporosis. Osteoporosis is a condition in which bone destruction happens more quickly than new bone creation. To help prevent osteoporosis or  the bone fractures that can happen because of osteoporosis, you may take the following actions: If you are 24-54 years old, get at least 1,000 mg of calcium and at least 600 international units (IU) of vitamin D  per day. If you are older than age 75 but younger than age 30, get at least 1,200 mg of calcium and at least 600 international units (IU) of vitamin D  per day. If you are older than age 8, get at least 1,200 mg of calcium and at least 800 international units (IU) of vitamin D  per day. Smoking and drinking excessive alcohol increase the risk of osteoporosis. Eat foods that are rich in calcium and vitamin D , and do weight-bearing exercises several times each week as directed by your health care provider. How does menopause affect my mental health? Depression may occur at any age, but it is more common as you become older. Common symptoms of depression include: Feeling depressed. Changes in sleep patterns. Changes in appetite or eating patterns. Feeling an overall lack of motivation or enjoyment of activities that you previously enjoyed. Frequent crying spells. Talk with your health care provider if you think that you are experiencing any of these symptoms. General instructions See your health care provider for regular wellness exams and vaccines. This may include: Scheduling regular health, dental, and eye exams. Getting and maintaining your vaccines. These include: Influenza vaccine. Get this vaccine each year before the flu season begins. Pneumonia vaccine. Shingles vaccine. Tetanus, diphtheria, and pertussis (Tdap) booster vaccine. Your health care provider may also recommend other immunizations. Tell your health care provider if you have ever been abused or do not feel safe at home. Summary Menopause is a normal process in which your ability to get pregnant comes to an end. This condition causes hot flashes, night sweats, decreased interest in sex, mood swings, headaches, or lack  of sleep. Treatment for this condition may include hormone replacement therapy. Take actions to keep yourself healthy, including exercising regularly, eating a healthy diet, watching your weight, and checking your blood pressure and blood sugar levels. Get screened for cancer and depression. Make sure that you are up to date with all your vaccines. This information is not intended to replace advice given to you by your health care provider. Make sure you discuss any questions you have with your health care provider. Document Revised: 05/16/2020 Document Reviewed: 05/16/2020 Elsevier Patient Education  2024 [employer].

## 2023-11-23 LAB — LIPID PANEL
Cholesterol: 200 mg/dL — ABNORMAL HIGH (ref <200)
HDL: 45 mg/dL — ABNORMAL LOW (ref >=50)
LDL Cholesterol (Calc): 119 mg/dL — ABNORMAL HIGH
Non-HDL Cholesterol (Calc): 155 mg/dL — ABNORMAL HIGH (ref <130)
Total CHOL/HDL Ratio: 4.4 (calc) (ref <5.0)
Triglycerides: 239 mg/dL — ABNORMAL HIGH (ref <150)

## 2023-11-23 LAB — COMPREHENSIVE METABOLIC PANEL WITH GFR
AG Ratio: 1.4 (calc) (ref 1.0–2.5)
ALT: 27 U/L (ref 6–29)
AST: 21 U/L (ref 10–35)
Albumin: 4.2 g/dL (ref 3.6–5.1)
Alkaline phosphatase (APISO): 99 U/L (ref 37–153)
BUN: 12 mg/dL (ref 7–25)
CO2: 29 mmol/L (ref 20–32)
Calcium: 9.3 mg/dL (ref 8.6–10.4)
Chloride: 100 mmol/L (ref 98–110)
Creat: 0.76 mg/dL (ref 0.50–1.03)
Globulin: 3 g/dL (ref 1.9–3.7)
Glucose, Bld: 92 mg/dL (ref 65–99)
Potassium: 4 mmol/L (ref 3.5–5.3)
Sodium: 138 mmol/L (ref 135–146)
Total Bilirubin: 0.4 mg/dL (ref 0.2–1.2)
Total Protein: 7.2 g/dL (ref 6.1–8.1)
eGFR: 92 mL/min/1.73m2 (ref >=60)

## 2023-11-23 LAB — CBC
HCT: 44.2 % (ref 35.0–45.0)
Hemoglobin: 14.7 g/dL (ref 11.7–15.5)
MCH: 28.5 pg (ref 27.0–33.0)
MCHC: 33.3 g/dL (ref 32.0–36.0)
MCV: 85.7 fL (ref 80.0–100.0)
MPV: 10.8 fL (ref 7.5–12.5)
Platelets: 265 Thousand/uL (ref 140–400)
RBC: 5.16 Million/uL — ABNORMAL HIGH (ref 3.80–5.10)
RDW: 12.8 % (ref 11.0–15.0)
WBC: 7.2 Thousand/uL (ref 3.8–10.8)

## 2023-11-23 LAB — HEMOGLOBIN A1C
Hgb A1c MFr Bld: 5.9 % — ABNORMAL HIGH (ref <5.7)
Mean Plasma Glucose: 123 mg/dL
eAG (mmol/L): 6.8 mmol/L

## 2023-11-25 ENCOUNTER — Ambulatory Visit: Payer: Self-pay | Admitting: Internal Medicine

## 2024-01-06 ENCOUNTER — Other Ambulatory Visit: Payer: Self-pay | Admitting: Internal Medicine

## 2024-01-08 ENCOUNTER — Other Ambulatory Visit: Payer: Self-pay | Admitting: Internal Medicine

## 2024-01-08 NOTE — Telephone Encounter (Signed)
 Requested medication (s) are due for refill today: Yes  Requested medication (s) are on the active medication list: Yes  Last refill:  01/08/24  Future visit scheduled: Yes  Notes to clinic:  Unable to refill per protocol, cannot delegate.      Requested Prescriptions  Pending Prescriptions Disp Refills   ALPRAZolam  (XANAX ) 0.5 MG tablet 20 tablet 0     Not Delegated - Psychiatry: Anxiolytics/Hypnotics 2 Failed - 01/08/2024  5:44 PM      Failed - This refill cannot be delegated      Failed - Urine Drug Screen completed in last 360 days      Passed - Patient is not pregnant      Passed - Valid encounter within last 6 months    Recent Outpatient Visits           1 month ago Encounter for general adult medical examination with abnormal findings   [place] [facility] [place], Angeline ORN, NP   7 months ago Pure hypercholesterolemia   [place] [facility] [place], Angeline ORN, [place]

## 2024-01-08 NOTE — Telephone Encounter (Signed)
 Requested medication (s) are due for refill today: yes  Requested medication (s) are on the active medication list: yes  Last refill:  07/29/23  Future visit scheduled: yes  Notes to clinic:  Unable to refill per protocol, cannot delegate.      Requested Prescriptions  Pending Prescriptions Disp Refills   ALPRAZolam  (XANAX ) 0.5 MG tablet [Pharmacy Med Name: ALPRAZOLAM  0.5MG  TABLETS] 20 tablet     Sig: TAKE 1 TABLET(0.5 MG) BY MOUTH DAILY AS NEEDED FOR ANXIETY     Not Delegated - Psychiatry: Anxiolytics/Hypnotics 2 Failed - 01/08/2024 11:15 AM      Failed - This refill cannot be delegated      Failed - Urine Drug Screen completed in last 360 days      Passed - Patient is not pregnant      Passed - Valid encounter within last 6 months    Recent Outpatient Visits           1 month ago Encounter for general adult medical examination with abnormal findings   [place] [facility] [place], Angeline ORN, NP   7 months ago Pure hypercholesterolemia   [facility] [place], Angeline ORN, [place]

## 2024-01-08 NOTE — Telephone Encounter (Unsigned)
 Copied from CRM #[id number]. Topic: Clinical - Medication Refill >> Jan 08, 2024 10:25 AM Amy B wrote: Medication:  ALPRAZolam  (XANAX ) 0.5 MG tablet   Has the patient contacted their pharmacy? Yes (Agent: If no, request that the patient contact the pharmacy for the refill. If patient does not wish to contact the pharmacy document the reason why and proceed with request.) (Agent: If yes, when and what did the pharmacy advise?)  This is the patient's preferred pharmacy:  [id number] [place], [place] - [address] AT [facility] OF [address] [place] [address] Phone: [phone] Fax: [phone]  Is this the correct pharmacy for this prescription? Yes If no, delete pharmacy and type the correct one.   Has the prescription been filled recently? No  Is the patient out of the medication? Yes  Has the patient been seen for an appointment in the last year OR does the patient have an upcoming appointment? Yes  Can we respond through MyChart? Yes  Agent: Please be advised that Rx refills may take up to 3 business days. We ask that you follow-up with your pharmacy.

## 2024-01-10 ENCOUNTER — Ambulatory Visit
Admission: RE | Admit: 2024-01-10 | Discharge: 2024-01-10 | Disposition: A | Discharge status: Home / Self Care | Source: Ambulatory Visit | Attending: Internal Medicine | Admitting: Internal Medicine

## 2024-01-10 DIAGNOSIS — Z1231 Encounter for screening mammogram for malignant neoplasm of breast: Secondary | ICD-10-CM | POA: Insufficient documentation

## 2024-05-22 ENCOUNTER — Ambulatory Visit: Admitting: Internal Medicine
